# Patient Record
Sex: Female | Born: 2000
Health system: Southern US, Community
[De-identification: ages and names within clinical notes are randomized; demographics above are authoritative.]

## PROBLEM LIST (undated history)

## (undated) ENCOUNTER — Emergency Department (HOSPITAL_COMMUNITY): Admission: EM | Payer: Self-pay | Source: Home / Self Care

## (undated) DIAGNOSIS — F32A Depression, unspecified: Secondary | ICD-10-CM

## (undated) DIAGNOSIS — F419 Anxiety disorder, unspecified: Secondary | ICD-10-CM

## (undated) DIAGNOSIS — F329 Major depressive disorder, single episode, unspecified: Secondary | ICD-10-CM

## (undated) HISTORY — PX: KNEE SURGERY: SHX244

## (undated) HISTORY — DX: Depression, unspecified: F32.A

## (undated) HISTORY — DX: Anxiety disorder, unspecified: F41.9

---
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
## 2018-01-27 DIAGNOSIS — J029 Acute pharyngitis, unspecified: Secondary | ICD-10-CM | POA: Diagnosis not present

## 2018-02-21 DIAGNOSIS — G44209 Tension-type headache, unspecified, not intractable: Secondary | ICD-10-CM | POA: Diagnosis not present

## 2018-06-16 DIAGNOSIS — M25561 Pain in right knee: Secondary | ICD-10-CM | POA: Diagnosis not present

## 2018-06-16 DIAGNOSIS — M25562 Pain in left knee: Secondary | ICD-10-CM | POA: Diagnosis not present

## 2018-06-16 DIAGNOSIS — H43393 Other vitreous opacities, bilateral: Secondary | ICD-10-CM | POA: Diagnosis not present

## 2018-06-16 DIAGNOSIS — H47393 Other disorders of optic disc, bilateral: Secondary | ICD-10-CM | POA: Diagnosis not present

## 2018-07-07 ENCOUNTER — Emergency Department (HOSPITAL_COMMUNITY): Payer: 59

## 2018-07-07 ENCOUNTER — Encounter (HOSPITAL_COMMUNITY): Payer: Self-pay | Admitting: *Deleted

## 2018-07-07 ENCOUNTER — Emergency Department (HOSPITAL_COMMUNITY)
Admission: EM | Admit: 2018-07-07 | Discharge: 2018-07-07 | Disposition: A | Discharge status: Home / Self Care | Payer: 59 | Attending: Emergency Medicine | Admitting: Emergency Medicine

## 2018-07-07 DIAGNOSIS — M25561 Pain in right knee: Secondary | ICD-10-CM | POA: Diagnosis not present

## 2018-07-07 DIAGNOSIS — Z79899 Other long term (current) drug therapy: Secondary | ICD-10-CM | POA: Insufficient documentation

## 2018-07-07 MED ORDER — IBUPROFEN 400 MG PO TABS
600.0000 mg | ORAL_TABLET | Freq: Once | ORAL | Status: AC
  Administered 2018-07-07: 600 mg via ORAL
  Filled 2018-07-07: qty 1

## 2018-07-07 MED ORDER — OXYCODONE-ACETAMINOPHEN 5-325 MG PO TABS
1.0000 | ORAL_TABLET | Freq: Once | ORAL | Status: AC
Start: 2018-07-07 — End: 2018-07-07
  Administered 2018-07-07: 1 via ORAL
  Filled 2018-07-07: qty 1

## 2018-07-07 NOTE — ED Notes (Signed)
Patient Alert and oriented to baseline. Stable and ambulatory to baseline. Patient verbalized understanding of the discharge instructions.  Patient belongings were taken by the patient.   

## 2018-07-07 NOTE — ED Notes (Signed)
Pt standing at bedside, ambulatory without any assistance.

## 2018-07-07 NOTE — ED Notes (Signed)
ED Provider at bedside. 

## 2018-07-07 NOTE — ED Provider Notes (Signed)
[facility] EMERGENCY DEPARTMENT Provider Note   CSN: [id number] Arrival date & time: 07/07/18  1246     History   Chief Complaint Chief Complaint  Patient presents with  . Knee Injury    HPI Diana Sandoval is a 17 y.o. female.  HPI Patient is a 16 year old female with history of recurrent patellar dislocations who presents to the emergency department today for evaluation of right knee pain.  States that she was getting ready for work today and felt her knee pop like it was dislocated again.  States she has had increased pain with trying to bend her knee and has been unable to bend at the knee since episode due to pain.  No numbness/weakness. Is followed by orthopedic surgery for her recurrent dislocations and was seen by them approximately 2 weeks and told to wear a knee brace.  Reports that she was at the beach last week and has not been wearing her brace recently.  Did apply brace after today which does provide some support.  Does not use crutches. No medications prior to coming to the emergency department.  States that she thinks it is more swollen than normal.  No history of knee surgeries.  States she was told by her orthopedist that she may require knee surgery in the future if symptoms persist.  Describes pain as "it feels like Velcro in my knee" and sometimes shooting pain. No fevers.   History reviewed. No pertinent past medical history.  There are no active problems to display for this patient.   History reviewed. No pertinent surgical history.   OB History   None      Home Medications    Prior to Admission medications   Medication Sig Start Date End Date Taking? Authorizing Provider  ibuprofen (ADVIL,MOTRIN) 400 MG tablet Take 400 mg by mouth every 6 (six) hours as needed.    [provider]  loratadine (CLARITIN) 10 MG tablet Take 1 tablet (10 mg total) by mouth daily. 11/21/14   Piepenbrink, Victorino Dike, PA-C  moxifloxacin (VIGAMOX) 0.5 %  ophthalmic solution Place 1 drop into both eyes 3 (three) times daily.    [provider]  naphazoline-pheniramine (NAPHCON-A) 0.025-0.3 % ophthalmic solution Place 1 drop into both eyes 4 (four) times daily as needed for irritation or allergies. 11/21/14   PiepenbrinkVictorino Dike, PA-C    Family History History reviewed. No pertinent family history.  Social History Social History   Tobacco Use  . Smoking status: Never Smoker  . Smokeless tobacco: Never Used  Substance Use Topics  . Alcohol use: Not on file  . Drug use: Not on file     Allergies   Patient has no known allergies.   Review of Systems Review of Systems  Constitutional: Negative for chills and fever.  HENT: Negative for congestion and sore throat.   Gastrointestinal: Negative for diarrhea, nausea and vomiting.  Genitourinary: Negative for dysuria.  Musculoskeletal: Positive for arthralgias (right knee pain) and gait problem. Negative for back pain and neck pain.  Skin: Negative for color change, rash and wound.  Neurological: Negative for weakness and numbness.  All other systems reviewed and are negative.    Physical Exam Updated Vital Signs BP (!) 128/91 (BP Location: Left Arm)   Pulse 87   Temp 98.8 F (37.1 C) (Oral)   Resp 16   LMP 06/24/2018   SpO2 99%   Physical Exam  Constitutional: She appears well-developed and well-nourished. No distress.  HENT:  Head: Normocephalic and atraumatic.  Eyes: Conjunctivae are normal.  Neck: Neck supple.  Cardiovascular: Regular rhythm and intact distal pulses.  Pulmonary/Chest: Effort normal and breath sounds normal. No respiratory distress.  Abdominal: Soft. She exhibits no distension. There is no tenderness.  Musculoskeletal: She exhibits no edema.  Pain and swelling at right knee. No major effusion. No obvious dislocation or deformity. No warmth or erythema. No rash or wounds. Full ROM at hip and ankle. Pt hesitant to move knee initially with  anticipatory guarding however will fully flex/extend actively and passively. Sensation intact throughout BLEs. Symmetric 2+ DP/PT pulses bilat.   Neurological: She is alert.  Skin: Skin is warm and dry. Capillary refill takes less than 2 seconds. She is not diaphoretic.  Psychiatric: She has a normal mood and affect.  Nursing note and vitals reviewed.    ED Treatments / Results  Labs (all labs ordered are listed, but only abnormal results are displayed) Labs Reviewed - No data to display  EKG None  Radiology Dg Knee Complete 4 Views Right  Result Date: 07/07/2018 CLINICAL DATA:  Acute knee pain for the past 2 weeks. No known injury. History of prior right patellar dislocation. EXAM: RIGHT KNEE - COMPLETE 4+ VIEW COMPARISON:  Right knee x-rays dated October 11, 2012. FINDINGS: No acute fracture or dislocation. No joint effusion. Joint spaces are preserved. Bone mineralization is normal. 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 Soft tissues are unremarkable. IMPRESSION: Negative. Electronically Signed   By: Obie DredgeWilliam T Derry M.D.   On: 07/07/2018 14:42    Procedures Procedures (including critical care time)  Medications Ordered in ED Medications  oxyCODONE-acetaminophen (PERCOCET/ROXICET) 5-325 MG per tablet 1 tablet (1 tablet Oral Given 07/07/18 1627)  ibuprofen (ADVIL,MOTRIN) tablet 600 mg (600 mg Oral Given 07/07/18 1627)     Initial Impression / Assessment and Plan / ED Course  I have reviewed the triage vital signs and the nursing notes.  Pertinent labs & imaging results that were available during my care of the patient were reviewed by me and considered in my medical decision making (see chart for details).    Patient is a 17 year old female with history of recurrent patellar dislocations who presents to the emergency department today for evaluation of acute on chronic right knee pain.   Afebrile, hemodynamically stable, well-appearing.  History exam as above.  No  obvious fracture dislocation including on review of x-ray.  Neurovascularly intact with no evidence of knee or patellar dislocation at this time.  Pain limits range of motion. Percocet and ibuprofen given for pain. Patient with no significant joint laxity to suggest complete ligamentous tear (negative anterior/posterior drawer, negative lachman) Does describe some catching of the knee which could indicate meniscal injury or cartilaginous bodies in joint space. Smoothly flexes/extends in ED w/no catching. Has knee brace with her from orthopedics. Crutches offered. Discussed RICE therapy and symptomatic mgt and encouraged close ortho f/u for chronic knee pain with concern for recurrent patellar dislocations. Pt and mother agreeable with this plan. Stable at discharge.   Case and plan of care discussed with Dr. Rhunette CroftNanavati.   Final Clinical Impressions(s) / ED Diagnoses   Final diagnoses:  Acute pain of right knee    ED Discharge Orders    None       Rigoberto Noelickens, Heloise Gordan, MD 07/07/18 Morton [id number]    Nanavati, Ankit, MD 07/09/18 [phone]

## 2018-07-07 NOTE — ED Triage Notes (Signed)
Pt in c/o right knee pain after possible injury at work, pt states she has trouble with her knee popping out of place and today pain increased, worse with movement or trying to bend her knee

## 2018-07-11 ENCOUNTER — Encounter: Payer: Self-pay | Admitting: Physical Therapy

## 2018-07-11 ENCOUNTER — Ambulatory Visit: Payer: 59 | Attending: Family Medicine | Admitting: Physical Therapy

## 2018-07-11 DIAGNOSIS — M6281 Muscle weakness (generalized): Secondary | ICD-10-CM | POA: Insufficient documentation

## 2018-07-11 DIAGNOSIS — G8929 Other chronic pain: Secondary | ICD-10-CM | POA: Insufficient documentation

## 2018-07-11 DIAGNOSIS — R262 Difficulty in walking, not elsewhere classified: Secondary | ICD-10-CM | POA: Diagnosis present

## 2018-07-11 DIAGNOSIS — M25562 Pain in left knee: Secondary | ICD-10-CM | POA: Insufficient documentation

## 2018-07-11 DIAGNOSIS — M25561 Pain in right knee: Secondary | ICD-10-CM | POA: Diagnosis not present

## 2018-07-11 NOTE — Therapy (Signed)
[facility] [address] [place], [place], [address] Phone: [phone]   Fax:  [phone]  Physical Therapy Evaluation  Patient Details  Name: Diana FleetingSydnee G Thal MRN: [id number] Date of Birth: [birth year] Referring Provider: Otho Darnerominic W. Mckinley MD   Encounter Date: 07/11/2018  PT End of Session - 07/11/18 1641    Visit Number  1    Number of Visits  8    Date for PT Re-Evaluation  08/15/18    PT Start Time  0950    PT Stop Time  1038    PT Time Calculation (min)  48 min    Activity Tolerance  Other (comment)   fear and apprehension with movement   Behavior During Therapy  Anxious       History reviewed. No pertinent past medical history.  History reviewed. No pertinent surgical history.  There were no vitals filed for this visit.   Subjective Assessment - 07/11/18 1637    Subjective  Patient arrives to physical therapy with mother wearing a soft knee brace on right knee with reports of a history of knee pain, subluxations, and dislocations. Patient reported this began approximately 6 years ago but the latest exacerbation occurred on 07/07/18 when she felt her R knee cap "popped out and in" while wearing the brace. She reported going to the ER secondary to pain to which X-Ray showed normal placement of patellas. Patient reports at worst pain is 6/10 with sharp pains throughout knee and in patella tendon. At best, patient's pain is 1/10 in L knee and 2/10 in right knee. Patient is having difficulty walking, ambulating steps, and is very apprehensive about any knee movement especially R knee ROM. Patient's goals are to decrease pain, stop the "popping" and climb steps without pain.    Patient is accompained by:  Family member    Pertinent History  history of multiple subluxations, panic attacks    Limitations  House hold activities;Walking    Diagnostic tests  x-ray    Patient Stated Goals  stop wearing the brace, climb steps without pain     Currently in Pain?  Yes    Pain Score  4     Pain Location  Knee    Pain Orientation  Right;Left    Pain Type  Acute pain    Pain Onset  More than a month ago    Pain Frequency  Intermittent    Aggravating Factors   movement, climbing steps    Pain Relieving Factors  ibuprofen    Effect of Pain on Daily Activities  difficulty walking, difficulty climbing steps         [facility] - 07/11/18 0001      Assessment   Medical Diagnosis  Right and left patellofemoral subluxation and patellofemoral syndrom    Referring Provider  Otho Darnerominic W. Mckinley MD    Onset Date/Surgical Date  07/07/18    Next MD Visit  07/15/18    Prior Therapy  yes      Precautions   Precautions  None      Restrictions   Weight Bearing Restrictions  No      Balance Screen   Has the patient fallen in the past 6 months  No    Has the patient had a decrease in activity level because of a fear of falling?   No    Is the patient reluctant to leave their home because of a fear of falling?   No  Home Environment   Living Environment  Private residence      Prior Function   Level of Independence  Independent    Vocation  Student      Observation/Other Assessments-Edema    Edema  Circumferential      Circumferential Edema   Circumferential - Right  36 cm at mid patella    Circumferential - Left   35 cm at mid patella      Posture/Postural Control   Posture Comments  noted with bilateral knee valgus in standing,      ROM / Strength   AROM / PROM / Strength  AROM;Strength      AROM   Overall AROM   Within functional limits for tasks performed    Overall AROM Comments  WNL bilaterally but very apprehensive to move with increased time to reach end range      Strength   Overall Strength  Deficits    Overall Strength Comments  fearful of pain and subluxation with bilateral knee MMT    Strength Assessment Site  Knee;Hip    Right/Left Hip  Right;Left    Right Hip Flexion  3+/5    Right Hip  Extension  3/5    Right Hip ABduction  3+/5    Left Hip Flexion  3+/5    Left Hip Extension  3+/5    Left Hip ABduction  3+/5    Right/Left Knee  Left;Right    Right Knee Flexion  3+/5   fearful   Right Knee Extension  3+/5    Left Knee Flexion  3+/5    Left Knee Extension  3+/5      Palpation   Palpation comment  very tender to palpation along bilateral patella R>L                Objective measurements completed on examination: See above findings.              PT Education - 07/11/18 1641    Education Details  Quad sets, hamstring isometric, glute sets, abduction isometric    Person(s) Educated  Patient;Parent(s)    Methods  Explanation;Demonstration;Handout    Comprehension  Returned demonstration;Verbalized understanding          PT Long Term Goals - 07/11/18 1647      PT LONG TERM GOAL #1   Title  Patient will be independent with HEP    Time  4    Period  Weeks    Status  New      PT LONG TERM GOAL #2   Title  Patient will demonstrate 4/5 or greater bilateral knee MMT in both planes to improve stability during gait and functional activities    Time  4    Period  Weeks    Status  New      PT LONG TERM GOAL #3   Title  Patient will negotiate a full flight of stairs with reciprocating gait pattern and pain less than 3/10 in bilateral knees.    Time  4    Period  Weeks    Status  New      PT LONG TERM GOAL #4   Title  Patient will report ability to ambulate campus with less than 3/10 pain in bilateral knees.    Time  4    Period  Weeks    Status  New             Plan - 07/11/18 1643  Clinical Impression Statement  Patient is a 17 year old female who presents to physical therapy with a soft right knee brace donned and bilateral knee pain, increased edema and decreased MMT in bilateral knees and bilateral hips. Patient was very apprehensive to move in any plane of motion secondary to crepitus and the fear of her bilateral patellas  subluxing. Patient otherwise has WNL ROM bilaterally. Patient tender to touch around patella especially medial and lateral aspects of both patellas. Patient noted with increased edema R>L. Patient noted with laterally placed patellas R>L and noted knee valgus R>L in standing. Patient expressed her fear of pain and subluxation throughout assessment portion of evaluation and was unable to relax LEs during assessment. Patella mobility and special tests were not assessed secondary to fear. Patient may benefit from skilled physical therapy to decrease pain, improve mobility and address patient's goals.     Clinical Presentation  Evolving    Clinical Decision Making  Low    Rehab Potential  Good    Clinical Impairments Affecting Rehab Potential  fear of movement, panic attacks,    PT Frequency  2x / week    PT Duration  4 weeks    PT Treatment/Interventions  Cryotherapy;Microbiologist;Therapeutic exercise;Gait training;Stair training;Neuromuscular re-education;Patient/family education;Vasopneumatic Device;Taping;Manual techniques;Passive range of motion    PT Next Visit Plan  Quad strengthening to patient tolerance, if intolerable, VMS to R Quad,  hip strengthening, modalities PRN for pain relief.    PT Home Exercise Plan  see patient education    Consulted and Agree with Plan of Care  Patient;Family member/caregiver    Family Member Consulted  Mother       Patient will benefit from skilled therapeutic intervention in order to improve the following deficits and impairments:  Pain, Postural dysfunction, Decreased strength, Decreased range of motion, Decreased activity tolerance, Difficulty walking  Visit Diagnosis: Chronic pain of right knee  Chronic pain of left knee  Muscle weakness (generalized)  Difficulty in walking, not elsewhere classified     Problem List There are no active problems to display for this patient.  Guss Bunde, PT, DPT 07/11/2018, 4:56  PM  [facility] [address] [place], [place], [address] Phone: [phone]   Fax:  [phone]  Name: ASHLE STIEF MRN: [id number] Date of Birth: February 16, 2001

## 2018-07-15 ENCOUNTER — Ambulatory Visit: Payer: 59 | Admitting: Physical Therapy

## 2018-07-15 ENCOUNTER — Ambulatory Visit (INDEPENDENT_AMBULATORY_CARE_PROVIDER_SITE_OTHER): Payer: 59 | Admitting: Women's Health

## 2018-07-15 ENCOUNTER — Encounter: Payer: Self-pay | Admitting: Women's Health

## 2018-07-15 VITALS — BP 118/76 | Ht 68.0 in | Wt 141.0 lb

## 2018-07-15 DIAGNOSIS — R262 Difficulty in walking, not elsewhere classified: Secondary | ICD-10-CM

## 2018-07-15 DIAGNOSIS — M6281 Muscle weakness (generalized): Secondary | ICD-10-CM

## 2018-07-15 DIAGNOSIS — M25562 Pain in left knee: Secondary | ICD-10-CM

## 2018-07-15 DIAGNOSIS — M25561 Pain in right knee: Secondary | ICD-10-CM | POA: Diagnosis not present

## 2018-07-15 DIAGNOSIS — Z789 Other specified health status: Secondary | ICD-10-CM | POA: Diagnosis not present

## 2018-07-15 DIAGNOSIS — Z01419 Encounter for gynecological examination (general) (routine) without abnormal findings: Secondary | ICD-10-CM

## 2018-07-15 DIAGNOSIS — IMO0001 Reserved for inherently not codable concepts without codable children: Secondary | ICD-10-CM

## 2018-07-15 DIAGNOSIS — G8929 Other chronic pain: Secondary | ICD-10-CM

## 2018-07-15 MED ORDER — DROSPIRENONE-ETHINYL ESTRADIOL 3-0.02 MG PO TABS: 1.0000 | ORAL_TABLET | Freq: Every day | ORAL | 4 refills | Status: DC

## 2018-07-15 MED ORDER — NORETHIN ACE-ETH ESTRAD-FE 1-20 MG-MCG PO TABS: 1.0000 | ORAL_TABLET | Freq: Every day | ORAL | 4 refills | Status: DC

## 2018-07-15 NOTE — Therapy (Signed)
[facility] [address] [place], [place], [address] Phone: [phone]   Fax:  [phone]  Physical Therapy Treatment  Patient Details  Name: Diana Sandoval MRN: [id number] Date of Birth: Mar 12, 2001 Referring Provider: Otho Darnerominic W. Mckinley MD   Encounter Date: 07/15/2018  PT End of Session - 07/15/18 1037    Visit Number  2    Number of Visits  8    Date for PT Re-Evaluation  08/15/18    PT Start Time  0947    PT Stop Time  1036    PT Time Calculation (min)  49 min    Activity Tolerance  Other (comment)    Behavior During Therapy  Anxious       Past Medical History:  Diagnosis Date  . Anxiety     No past surgical history on file.  There were no vitals filed for this visit.  Subjective Assessment - 07/15/18 2130    Subjective  Patient reports 2/10 in right knee today. Patient has been complaint with exercises with exception of hip abduction isometric secondary to fear.     Patient is accompained by:  Family member    Pertinent History  history of multiple subluxations, panic attacks    Limitations  House hold activities;Walking    Diagnostic tests  x-ray    Currently in Pain?  Yes    Pain Score  2     Pain Orientation  Right    Pain Descriptors / Indicators  Sore    Pain Type  Acute pain    Pain Onset  More than a month ago         [facility] - 07/15/18 0001      Assessment   Medical Diagnosis  Right and left patellofemoral subluxation and patellofemoral syndrom    Onset Date/Surgical Date  07/07/18    Next MD Visit  07/15/18    Prior Therapy  yes                   [facility] Adult PT Treatment/Exercise - 07/15/18 0001      Exercises   Exercises  Knee/Hip      Knee/Hip Exercises: Aerobic   Stationary Bike  level 2 x1055minutes      Knee/Hip Exercises: Standing   Hip Flexion  AROM;Both;10 reps;Knee bent    Hip Abduction  AROM   attempted however patient apprehensive with movement     Knee/Hip Exercises: Supine   Quad Sets  AROM;Strengthening;Both;2 sets;10 reps    Bridges  AROM;Strengthening;Both;2 sets;10 reps    Other Supine Knee/Hip Exercises  hip abduction isometric 5" hold x20 each; hip adduction ball squeeze 5" hold x20 supine clam shells 2x10 yellow theraband                  PT Long Term Goals - 07/11/18 1647      PT LONG TERM GOAL #1   Title  Patient will be independent with HEP    Time  4    Period  Weeks    Status  New      PT LONG TERM GOAL #2   Title  Patient will demonstrate 4/5 or greater bilateral knee MMT in both planes to improve stability during gait and functional activities    Time  4    Period  Weeks    Status  New      PT LONG TERM GOAL #3   Title  Patient will negotiate a  full flight of stairs with reciprocating gait pattern and pain less than 3/10 in bilateral knees.    Time  4    Period  Weeks    Status  New      PT LONG TERM GOAL #4   Title  Patient will report ability to ambulate campus with less than 3/10 pain in bilateral knees.    Time  4    Period  Weeks    Status  New            Plan - 07/15/18 2134    Clinical Impression Statement  Patient was able to tolerate treatment fairly. Patient was very nervous and apprehensive about movement and frequently placed her right hand on the lateral aspect of her right knee to prevent subluxation. Patient required increased time to perform activity secondary to weakness as well as a fear to transition from knee flexion to extension. Patient and mother reviewed HEP to which both reported understanding. Patient to attend one more visit with emphasis on independence on HEP as [place]. PT [place] to continue skilled physical therapy to address deficits. Patient and mother reported understanding.     Clinical Presentation  Evolving    Clinical Decision Making  Low    Rehab Potential   Good    Clinical Impairments Affecting Rehab Potential  fear of movement, panic attacks,    PT Frequency  2x / week    PT Duration  4 weeks    PT Treatment/Interventions  Cryotherapy;Microbiologistlectrical Stimulation;Balance training;Therapeutic exercise;Gait training;Stair training;Neuromuscular re-education;Patient/family education;Vasopneumatic Device;Taping;Manual techniques;Passive range of motion    PT Next Visit Plan  Quad strengthening to patient tolerance, if intolerable, VMS to R Quad,  hip strengthening, modalities PRN for pain relief.    Consulted and Agree with Plan of Care  Patient;Family member/caregiver    Family Member Consulted  Mother       Patient will benefit from skilled therapeutic intervention in order to improve the following deficits and impairments:  Pain, Postural dysfunction, Decreased strength, Decreased range of motion, Decreased activity tolerance, Difficulty walking  Visit Diagnosis: Chronic pain of right knee  Chronic pain of left knee  Muscle weakness (generalized)  Difficulty in walking, not elsewhere classified     Problem List There are no active problems to display for this patient.  Guss BundeKrystle Clif Serio, PT, DPT 07/15/2018, 9:39 PM  [facility] [address] [place], [place], [address] Phone: [phone]   Fax:  [phone]  Name: Diana Sandoval MRN: [id number] Date of Birth: 2001/05/11

## 2018-07-15 NOTE — Progress Notes (Signed)
Diana Sandoval [birth year] [id number]    History:    Presents for new patient annual exam.  Cycles every 4 to 5 weeks for 5 days sexually active x1 with condom, denies need for STD screen.  Would like to start on OCs.  Unsure if received gardasil but does think so.  Past medical history, past surgical history, family history and social history were all reviewed and documented in the EPIC chart.  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  Mother hypertension, father healthy.  ROS:  A ROS was performed and pertinent positives and negatives are included.  Exam:  Vitals:   07/15/18 1509  BP: 118/76  Weight: 141 lb (64 kg)  Height: 5\' 8"  (1.727 m)   Body mass index is 21.44 kg/m.   General appearance:  Normal Thyroid:  Symmetrical, normal in size, without palpable masses or nodularity. Respiratory  Auscultation:  Clear without wheezing or rhonchi Cardiovascular  Auscultation:  Regular rate, without rubs, murmurs or gallops  Edema/varicosities:  Not grossly evident Abdominal  Soft,nontender, without masses, guarding or rebound.  Liver/spleen:  No organomegaly noted  Hernia:  None appreciated  Skin  Inspection:  Grossly normal   Breasts: Examined lying and sitting.     Right: Without masses, retractions, discharge or axillary adenopathy.     Left: Without masses, retractions, discharge or axillary adenopathy. Gentitourinary   Inguinal/mons:  Normal without inguinal adenopathy  External genitalia:  Normal  BUS/Urethra/Skene's glands:  Normal  Vagina:  Normal  Cervix:  Normal  Uterus: normal in size, shape and contour.  Midline and mobile  Adnexa/parametria:     Rt: Without masses or tenderness.   Lt: Without masses or tenderness.  Anus and perineum: Normal    Assessment/Plan:  17 y.o. S [employer] G0 for annual exam no complaints.  Monthly cycle desiring OCs Anxiety/needle phobia  Plan: Options reviewed,  does have acne and anxiety.  Yaz prescription, proper use, slight risk for blood clots and strokes reviewed.  Start up instructions discussed.  Encouraged condoms if sexually active, best to continue abstinence.  SBE's, exercise, calcium rich foods, MVI daily encouraged.  Campus safety discussed.  Declined blood work, needle phobia, also does have anxiety and questions if needs medication.  Strongly encouraged counseling first, best to proceed with counseling prior to medication.  Encouraged to schedule at student health.  Instructed to follow-up with primary care/pediatrician if did not receive Gardasil to return to office to complete series.   Harrington Challengerancy J Scarleth Brame [facility]NP, 3:17 PM 07/15/2018

## 2018-07-15 NOTE — Patient Instructions (Addendum)
[facility]  Living With Anxiety After being diagnosed with an anxiety disorder, you may be relieved to know why you have felt or behaved a certain way. It is natural to also feel overwhelmed about the treatment ahead and what it will mean for your life. With care and support, you can manage this condition and recover from it. How to cope with anxiety Dealing with stress Stress is your body's reaction to life changes and events, both good and bad. Stress can last just a few hours or it can be ongoing. Stress can play a major role in anxiety, so it is important to learn both how to cope with stress and how to think about it differently. Talk with your health care provider or a counselor to learn more about stress reduction. He or she may suggest some stress reduction techniques, such as:  Music therapy. This can include creating or listening to music that you enjoy and that inspires you.  Mindfulness-based meditation. This involves being aware of your normal breaths, rather than trying to control your breathing. It can be done while sitting or walking.  Centering prayer. This is a kind of meditation that involves focusing on a word, phrase, or sacred image that is meaningful to you and that brings you peace.  Deep breathing. To do this, expand your stomach and inhale slowly through your nose. Hold your breath for 3-5 seconds. Then exhale slowly, allowing your stomach muscles to relax.  Self-talk. This is a skill where you identify thought patterns that lead to anxiety reactions and correct those thoughts.  Muscle relaxation. This involves tensing muscles then relaxing them.  Choose a stress reduction technique that fits your lifestyle and personality. Stress reduction techniques take time and practice. Set aside 5-15 minutes a day to do them. Therapists can offer training in these techniques. The training may be covered by some insurance plans. Other things you can do to manage stress include:  Keeping  a stress diary. This can help you learn what triggers your stress and ways to control your response.  Thinking about how you respond to certain situations. You may not be able to control everything, but you can control your reaction.  Making time for activities that help you relax, and not feeling guilty about spending your time in this way.  Therapy combined with coping and stress-reduction skills provides the best chance for successful treatment. Medicines Medicines can help ease symptoms. Medicines for anxiety include:  Anti-anxiety drugs.  Antidepressants.  Beta-blockers.  Medicines may be used as the main treatment for anxiety disorder, along with therapy, or if other treatments are not working. Medicines should be prescribed by a health care provider. Relationships Relationships can play a big part in helping you recover. Try to spend more time connecting with trusted friends and family members. Consider going to couples counseling, taking family education classes, or going to family therapy. Therapy can help you and others better understand the condition. How to recognize changes in your condition Everyone has a different response to treatment for anxiety. Recovery from anxiety happens when symptoms decrease and stop interfering with your daily activities at home or work. This may mean that you will start to:  Have better concentration and focus.  Sleep better.  Be less irritable.  Have more energy.  Have improved memory.  It is important to recognize when your condition is getting worse. Contact your health care provider if your symptoms interfere with home or work and you do not feel like  your condition is improving. Where to find help and support: You can get help and support from these sources:  Self-help groups.  Online and [employer].  A trusted spiritual leader.  Couples counseling.  Family education classes.  Family therapy.  Follow these  instructions at home:  Eat a healthy diet that includes plenty of vegetables, fruits, whole grains, low-fat dairy products, and lean protein. Do not eat a lot of foods that are high in solid fats, added sugars, or salt.  Exercise. Most adults should do the following: ? Exercise for at least 150 minutes each week. The exercise should increase your heart rate and make you sweat (moderate-intensity exercise). ? Strengthening exercises at least twice a week.  Cut down on caffeine, tobacco, alcohol, and other potentially harmful substances.  Get the right amount and quality of sleep. Most adults need 7-9 hours of sleep each night.  Make choices that simplify your life.  Take over-the-counter and prescription medicines only as told by your health care provider.  Avoid caffeine, alcohol, and certain over-the-counter cold medicines. These may make you feel worse. Ask your pharmacist which medicines to avoid.  Keep all follow-up visits as told by your health care provider. This is important. Questions to ask your health care provider  Would I benefit from therapy?  How often should I follow up with a health care provider?  How long do I need to take medicine?  Are there any long-term side effects of my medicine?  Are there any alternatives to taking medicine? Contact a health care provider if:  You have a hard time staying focused or finishing daily tasks.  You spend many hours a day feeling worried about everyday life.  You become exhausted by worry.  You start to have headaches, feel tense, or have nausea.  You urinate more than normal.  You have diarrhea. Get help right away if:  You have a racing heart and shortness of breath.  You have thoughts of hurting yourself or others. If you ever feel like you may hurt yourself or others, or have thoughts about taking your own life, get help right away. You can go to your nearest emergency department or call:  Your local emergency  services (911 in the [place]  A suicide crisis helpline, such as the [place] at 501-653-2311. This is open 24-hours a day.  Summary  Taking steps to deal with stress can help calm you.  Medicines cannot cure anxiety disorders, but they can help ease symptoms.  Family, friends, and partners can play a big part in helping you recover from an anxiety disorder. This information is not intended to replace advice given to you by your health care provider. Make sure you discuss any questions you have with your health care provider. Document Released: 11/13/2016 Document Revised: 11/13/2016 Document Reviewed: 11/13/2016 Elsevier Interactive Patient Education  2018 [employer] Maintenance, Female Adopting a healthy lifestyle and getting preventive care can go a long way to promote health and wellness. Talk with your health care provider about what schedule of regular examinations is right for you. This is a good chance for you to check in with your provider about disease prevention and staying healthy. In between checkups, there are plenty of things you can do on your own. Experts have done a lot of research about which lifestyle changes and preventive measures are most likely to keep you healthy. Ask your health care provider for more information. Weight and diet Eat  a healthy diet  Be sure to include plenty of vegetables, fruits, low-fat dairy products, and lean protein.  Do not eat a lot of foods high in solid fats, added sugars, or salt.  Get regular exercise. This is one of the most important things you can do for your health. ? Most adults should exercise for at least 150 minutes each week. The exercise should increase your heart rate and make you sweat (moderate-intensity exercise). ? Most adults should also do strengthening exercises at least twice a week. This is in addition to the moderate-intensity exercise.  Maintain a healthy weight  Body  mass index (BMI) is a measurement that can be used to identify possible weight problems. It estimates body fat based on height and weight. Your health care provider can help determine your BMI and help you achieve or maintain a healthy weight.  For females 24 years of age and older: ? A BMI below 18.5 is considered underweight. ? A BMI of 18.5 to 24.9 is normal. ? A BMI of 25 to 29.9 is considered overweight. ? A BMI of 30 and above is considered obese.  Watch levels of cholesterol and blood lipids  You should start having your blood tested for lipids and cholesterol at 19 years of age, then have this test every 5 years.  You may need to have your cholesterol levels checked more often if: ? Your lipid or cholesterol levels are high. ? You are older than 19 years of age. ? You are at high risk for heart disease.  Cancer screening Lung Cancer  Lung cancer screening is recommended for adults 36-12 years old who are at high risk for lung cancer because of a history of smoking.  A yearly low-dose CT scan of the lungs is recommended for people who: ? Currently smoke. ? Have quit within the past 15 years. ? Have at least a 30-pack-year history of smoking. A pack year is smoking an average of one pack of cigarettes a day for 1 year.  Yearly screening should continue until it has been 15 years since you quit.  Yearly screening should stop if you develop a health problem that would prevent you from having lung cancer treatment.  Breast Cancer  Practice breast self-awareness. This means understanding how your breasts normally appear and feel.  It also means doing regular breast self-exams. Let your health care provider know about any changes, no matter how small.  If you are in your 20s or 30s, you should have a clinical breast exam (CBE) by a health care provider every 1-3 years as part of a regular health exam.  If you are 20 or older, have a CBE every year. Also consider having a  breast X-ray (mammogram) every year.  If you have a family history of breast cancer, talk to your health care provider about genetic screening.  If you are at high risk for breast cancer, talk to your health care provider about having an MRI and a mammogram every year.  Breast cancer gene (BRCA) assessment is recommended for women who have family members with BRCA-related cancers. BRCA-related cancers include: ? Breast. ? Ovarian. ? Tubal. ? Peritoneal cancers.  Results of the assessment will determine the need for genetic counseling and BRCA1 and BRCA2 testing.  Cervical Cancer Your health care provider may recommend that you be screened regularly for cancer of the pelvic organs (ovaries, uterus, and vagina). This screening involves a pelvic examination, including checking for microscopic changes to the surface  of your cervix (Pap test). You may be encouraged to have this screening done every 3 years, beginning at age 80.  For women ages 35-65, health care providers may recommend pelvic exams and Pap testing every 3 years, or they may recommend the Pap and pelvic exam, combined with testing for human papilloma virus (HPV), every 5 years. Some types of HPV increase your risk of cervical cancer. Testing for HPV may also be done on women of any age with unclear Pap test results.  Other health care providers may not recommend any screening for nonpregnant women who are considered low risk for pelvic cancer and who do not have symptoms. Ask your health care provider if a screening pelvic exam is right for you.  If you have had past treatment for cervical cancer or a condition that could lead to cancer, you need Pap tests and screening for cancer for at least 20 years after your treatment. If Pap tests have been discontinued, your risk factors (such as having a new sexual partner) need to be reassessed to determine if screening should resume. Some women have medical problems that increase the chance  of getting cervical cancer. In these cases, your health care provider may recommend more frequent screening and Pap tests.  Colorectal Cancer  This type of cancer can be detected and often prevented.  Routine colorectal cancer screening usually begins at 19 years of age and continues through 19 years of age.  Your health care provider may recommend screening at an earlier age if you have risk factors for colon cancer.  Your health care provider may also recommend using home test kits to check for hidden blood in the stool.  A small camera at the end of a tube can be used to examine your colon directly (sigmoidoscopy or colonoscopy). This is done to check for the earliest forms of colorectal cancer.  Routine screening usually begins at age 85.  Direct examination of the colon should be repeated every 5-10 years through 19 years of age. However, you may need to be screened more often if early forms of precancerous polyps or small growths are found.  Skin Cancer  Check your skin from head to toe regularly.  Tell your health care provider about any new moles or changes in moles, especially if there is a change in a mole's shape or color.  Also tell your health care provider if you have a mole that is larger than the size of a pencil eraser.  Always use sunscreen. Apply sunscreen liberally and repeatedly throughout the day.  Protect yourself by wearing long sleeves, pants, a wide-brimmed hat, and sunglasses whenever you are outside.  Heart disease, diabetes, and high blood pressure  High blood pressure causes heart disease and increases the risk of stroke. High blood pressure is more likely to develop in: ? People who have blood pressure in the high end of the normal range (130-139/85-89 mm Hg). ? People who are overweight or obese. ? People who are African American.  If you are 73-64 years of age, have your blood pressure checked every 3-5 years. If you are 72 years of age or older,  have your blood pressure checked every year. You should have your blood pressure measured twice-once when you are at a hospital or clinic, and once when you are not at a hospital or clinic. Record the average of the two measurements. To check your blood pressure when you are not at a hospital or clinic, you can use: ?  An automated blood pressure machine at a pharmacy. ? A home blood pressure monitor.  If you are between 12 years and 65 years old, ask your health care provider if you should take aspirin to prevent strokes.  Have regular diabetes screenings. This involves taking a blood sample to check your fasting blood sugar level. ? If you are at a normal weight and have a low risk for diabetes, have this test once every three years after 19 years of age. ? If you are overweight and have a high risk for diabetes, consider being tested at a younger age or more often. Preventing infection Hepatitis B  If you have a higher risk for hepatitis B, you should be screened for this virus. You are considered at high risk for hepatitis B if: ? You were born in a country where hepatitis B is common. Ask your health care provider which countries are considered high risk. ? Your parents were born in a high-risk country, and you have not been immunized against hepatitis B (hepatitis B vaccine). ? You have HIV or AIDS. ? You use needles to inject street drugs. ? You live with someone who has hepatitis B. ? You have had sex with someone who has hepatitis B. ? You get hemodialysis treatment. ? You take certain medicines for conditions, including cancer, organ transplantation, and autoimmune conditions.  Hepatitis C  Blood testing is recommended for: ? Everyone born from [birth year] through 1967. ? Anyone with known risk factors for hepatitis C.  Sexually transmitted infections (STIs)  You should be screened for sexually transmitted infections (STIs) including gonorrhea and chlamydia if: ? You are sexually  active and are younger than 19 years of age. ? You are older than 19 years of age and your health care provider tells you that you are at risk for this type of infection. ? Your sexual activity has changed since you were last screened and you are at an increased risk for chlamydia or gonorrhea. Ask your health care provider if you are at risk.  If you do not have HIV, but are at risk, it may be recommended that you take a prescription medicine daily to prevent HIV infection. This is called pre-exposure prophylaxis (PrEP). You are considered at risk if: ? You are sexually active and do not regularly use condoms or know the HIV status of your partner(s). ? You take drugs by injection. ? You are sexually active with a partner who has HIV.  Talk with your health care provider about whether you are at high risk of being infected with HIV. If you choose to begin PrEP, you should first be tested for HIV. You should then be tested every 3 months for as long as you are taking PrEP. Pregnancy  If you are premenopausal and you may become pregnant, ask your health care provider about preconception counseling.  If you may become pregnant, take 400 to 800 micrograms (mcg) of folic acid every day.  If you want to prevent pregnancy, talk to your health care provider about birth control (contraception). Osteoporosis and menopause  Osteoporosis is a disease in which the bones lose minerals and strength with aging. This can result in serious bone fractures. Your risk for osteoporosis can be identified using a bone density scan.  If you are 91 years of age or older, or if you are at risk for osteoporosis and fractures, ask your health care provider if you should be screened.  Ask your health care provider whether you  should take a calcium or vitamin D supplement to lower your risk for osteoporosis.  Menopause may have certain physical symptoms and risks.  Hormone replacement therapy may reduce some of these  symptoms and risks. Talk to your health care provider about whether hormone replacement therapy is right for you. Follow these instructions at home:  Schedule regular health, dental, and eye exams.  Stay current with your immunizations.  Do not use any tobacco products including cigarettes, chewing tobacco, or electronic cigarettes.  If you are pregnant, do not drink alcohol.  If you are breastfeeding, limit how much and how often you drink alcohol.  Limit alcohol intake to no more than 1 drink per day for nonpregnant women. One drink equals 12 ounces of beer, 5 ounces of wine, or 1 ounces of hard liquor.  Do not use street drugs.  Do not share needles.  Ask your health care provider for help if you need support or information about quitting drugs.  Tell your health care provider if you often feel depressed.  Tell your health care provider if you have ever been abused or do not feel safe at home. This information is not intended to replace advice given to you by your health care provider. Make sure you discuss any questions you have with your health care provider. Document Released: 06/04/2011 Document Revised: 04/26/2016 Document Reviewed: 08/23/2015 Elsevier Interactive Patient Education  [employer].

## 2018-07-16 DIAGNOSIS — M25561 Pain in right knee: Secondary | ICD-10-CM | POA: Diagnosis not present

## 2018-07-17 ENCOUNTER — Ambulatory Visit: Payer: 59 | Admitting: Physical Therapy

## 2018-07-17 DIAGNOSIS — M6281 Muscle weakness (generalized): Secondary | ICD-10-CM

## 2018-07-17 DIAGNOSIS — M25562 Pain in left knee: Secondary | ICD-10-CM

## 2018-07-17 DIAGNOSIS — R262 Difficulty in walking, not elsewhere classified: Secondary | ICD-10-CM

## 2018-07-17 DIAGNOSIS — M25561 Pain in right knee: Principal | ICD-10-CM

## 2018-07-17 DIAGNOSIS — G8929 Other chronic pain: Secondary | ICD-10-CM

## 2018-07-17 NOTE — Therapy (Signed)
[facility] [place], [place], [address] Phone: [phone]   Fax:  [phone]  Physical Therapy Treatment  Patient Details  Name: Diana Sandoval MRN: [id number] Date of Birth: 2001/02/27 Referring Provider: Gentry Fitz MD   Encounter Date: 07/17/2018  PT End of Session - 07/17/18 1525    Visit Number  3    Number of Visits  8    Date for PT Re-Evaluation  08/15/18    PT Start Time  2637   late arrival   PT Stop Time  1436    PT Time Calculation (min)  39 min    Activity Tolerance  Other (comment)   right knee brace   Behavior During Therapy  Anxious       Past Medical History:  Diagnosis Date  . Anxiety     No past surgical history on file.  There were no vitals filed for this visit.  Subjective Assessment - 07/17/18 1702    Subjective  Patient reports L knee is beginning to hurt more than her right. She found an old brace that reports is more comfortable than her other.     Patient is accompained by:  Family member    Pertinent History  history of multiple subluxations, panic attacks    Limitations  House hold activities;Walking    Diagnostic tests  x-ray    Patient Stated Goals  stop wearing the brace, climb steps without pain    Currently in Pain?  Yes    Pain Score  4    2/10 in R knee   Pain Location  Knee    Pain Orientation  Left    Pain Descriptors / Indicators  Sore;Tightness    Pain Type  Acute pain    Pain Onset  Yesterday    Pain Frequency  Intermittent         [facility] PT Assessment - 07/17/18 0001      Assessment   Medical Diagnosis  Right and left patellofemoral subluxation and patellofemoral syndrom    Onset Date/Surgical Date  07/07/18    Next MD Visit  07/15/18    Prior Therapy  yes                   [facility] PT Treatment/Exercise - 07/17/18 0001      Exercises   Exercises  Knee/Hip      Knee/Hip Exercises: Aerobic   Stationary Bike  Level 2 x10  minutes      Knee/Hip Exercises: Standing   Functional Squat  Other (comment)   attempted, R knee pain   Other Standing Knee Exercises  lateral stepping x2 minutes      Knee/Hip Exercises: Supine   Quad Sets  AROM;Strengthening;Both;2 sets;10 reps    Short Arc Quad Sets  AROM;Strengthening;Both;2 sets;10 reps   5" hold     Manual Therapy   Manual Therapy  Taping    Kinesiotex  Ligament Correction   patella femoral tracking     Kinesiotix   Ligament Correction  patella tracking lateral tape only                  PT Long Term Goals - 07/17/18 1650      PT LONG TERM GOAL #1   Title  Patient will be independent with HEP    Time  4    Period  Weeks    Status  Achieved      PT LONG TERM  GOAL #2   Title  Patient will demonstrate 4/5 or greater bilateral knee MMT in both planes to improve stability during gait and functional activities    Time  4    Period  Weeks    Status  Not Met      PT LONG TERM GOAL #3   Title  Patient will negotiate a full flight of stairs with reciprocating gait pattern and pain less than 3/10 in bilateral knees.    Time  4    Period  Weeks    Status  Not Met      PT LONG TERM GOAL #4   Title  Patient will report ability to ambulate campus with less than 3/10 pain in bilateral knees.    Time  4    Period  Weeks    Status  Not Met            Plan - 07/17/18 1651    Clinical Impression Statement  Patient was able to tolerate treatment well despite reports of bilateral knee pain. Patient was able to demonstrate exercises on HEP with good form and minimal reports of pain, just muscle soreness. Patient very apprehensive with lateral stepping as patient reported that movement can cause subluxations. Patient was able to complete with no reports of pain.  Patient attempted mini squat; patient unable to tolerate at this time. Kinesio taping applied to lateral aspect of the patella. Medial tape was applied however patient was not comfortable  with it on therefore it was removed. Patient and mother educated kinesiotaping can be found at [employer]'s sports section, the proper application of tape, and youtube.com is a great resource if unable to remember proper application. Patient will be returning to school this weekend;  PT's goals were not met at this time. DC.     Clinical Presentation  Evolving    Clinical Decision Making  Low    Rehab Potential  Good    Clinical Impairments Affecting Rehab Potential  fear of movement, panic attacks,    PT Frequency  2x / week    PT Duration  4 weeks    PT Treatment/Interventions  Cryotherapy;Lobbyist;Therapeutic exercise;Gait training;Stair training;Neuromuscular re-education;Patient/family education;Vasopneumatic Device;Taping;Manual techniques;Passive range of motion    PT Next Visit Plan  DC    PT Home Exercise Plan  see patient education    Consulted and Agree with Plan of Care  Patient    Family Member Consulted  Mother       Patient will benefit from skilled therapeutic intervention in order to improve the following deficits and impairments:  Pain, Postural dysfunction, Decreased strength, Decreased range of motion, Decreased activity tolerance, Difficulty walking  Visit Diagnosis: Chronic pain of right knee  Chronic pain of left knee  Muscle weakness (generalized)  Difficulty in walking, not elsewhere classified     Problem List There are no active problems to display for this patient.  Gabriela Eves, PT, DPT 07/17/2018, 5:08 PM  [facility] [place], [place], [address] Phone: [phone]   Fax:  [phone]  Name: Diana Sandoval MRN: [id number] Date of Birth: [birth year]

## 2018-07-18 ENCOUNTER — Ambulatory Visit: Payer: 59 | Admitting: Physical Therapy

## 2018-07-29 DIAGNOSIS — S83001D Unspecified subluxation of right patella, subsequent encounter: Secondary | ICD-10-CM | POA: Diagnosis not present

## 2018-07-29 DIAGNOSIS — M25661 Stiffness of right knee, not elsewhere classified: Secondary | ICD-10-CM | POA: Diagnosis not present

## 2018-07-29 DIAGNOSIS — S83002D Unspecified subluxation of left patella, subsequent encounter: Secondary | ICD-10-CM | POA: Diagnosis not present

## 2018-07-31 DIAGNOSIS — S83001D Unspecified subluxation of right patella, subsequent encounter: Secondary | ICD-10-CM | POA: Diagnosis not present

## 2018-07-31 DIAGNOSIS — S83002D Unspecified subluxation of left patella, subsequent encounter: Secondary | ICD-10-CM | POA: Diagnosis not present

## 2018-07-31 DIAGNOSIS — M25661 Stiffness of right knee, not elsewhere classified: Secondary | ICD-10-CM | POA: Diagnosis not present

## 2018-08-12 DIAGNOSIS — S83001D Unspecified subluxation of right patella, subsequent encounter: Secondary | ICD-10-CM | POA: Diagnosis not present

## 2018-08-12 DIAGNOSIS — M25661 Stiffness of right knee, not elsewhere classified: Secondary | ICD-10-CM | POA: Diagnosis not present

## 2018-08-12 DIAGNOSIS — S83002D Unspecified subluxation of left patella, subsequent encounter: Secondary | ICD-10-CM | POA: Diagnosis not present

## 2018-08-14 DIAGNOSIS — S83002D Unspecified subluxation of left patella, subsequent encounter: Secondary | ICD-10-CM | POA: Diagnosis not present

## 2018-08-14 DIAGNOSIS — M25661 Stiffness of right knee, not elsewhere classified: Secondary | ICD-10-CM | POA: Diagnosis not present

## 2018-08-14 DIAGNOSIS — S83001D Unspecified subluxation of right patella, subsequent encounter: Secondary | ICD-10-CM | POA: Diagnosis not present

## 2018-08-19 DIAGNOSIS — S83002D Unspecified subluxation of left patella, subsequent encounter: Secondary | ICD-10-CM | POA: Diagnosis not present

## 2018-08-19 DIAGNOSIS — S83001D Unspecified subluxation of right patella, subsequent encounter: Secondary | ICD-10-CM | POA: Diagnosis not present

## 2018-08-19 DIAGNOSIS — M25661 Stiffness of right knee, not elsewhere classified: Secondary | ICD-10-CM | POA: Diagnosis not present

## 2018-08-21 DIAGNOSIS — S83001D Unspecified subluxation of right patella, subsequent encounter: Secondary | ICD-10-CM | POA: Diagnosis not present

## 2018-08-21 DIAGNOSIS — S83002D Unspecified subluxation of left patella, subsequent encounter: Secondary | ICD-10-CM | POA: Diagnosis not present

## 2018-08-21 DIAGNOSIS — M25661 Stiffness of right knee, not elsewhere classified: Secondary | ICD-10-CM | POA: Diagnosis not present

## 2018-08-28 DIAGNOSIS — S83001D Unspecified subluxation of right patella, subsequent encounter: Secondary | ICD-10-CM | POA: Diagnosis not present

## 2018-08-28 DIAGNOSIS — M25661 Stiffness of right knee, not elsewhere classified: Secondary | ICD-10-CM | POA: Diagnosis not present

## 2018-08-28 DIAGNOSIS — S83002D Unspecified subluxation of left patella, subsequent encounter: Secondary | ICD-10-CM | POA: Diagnosis not present

## 2018-09-02 DIAGNOSIS — M25661 Stiffness of right knee, not elsewhere classified: Secondary | ICD-10-CM | POA: Diagnosis not present

## 2018-09-02 DIAGNOSIS — S83002D Unspecified subluxation of left patella, subsequent encounter: Secondary | ICD-10-CM | POA: Diagnosis not present

## 2018-09-02 DIAGNOSIS — S83001D Unspecified subluxation of right patella, subsequent encounter: Secondary | ICD-10-CM | POA: Diagnosis not present

## 2018-09-04 DIAGNOSIS — S83002D Unspecified subluxation of left patella, subsequent encounter: Secondary | ICD-10-CM | POA: Diagnosis not present

## 2018-09-04 DIAGNOSIS — M25661 Stiffness of right knee, not elsewhere classified: Secondary | ICD-10-CM | POA: Diagnosis not present

## 2018-09-04 DIAGNOSIS — S83001D Unspecified subluxation of right patella, subsequent encounter: Secondary | ICD-10-CM | POA: Diagnosis not present

## 2018-09-09 DIAGNOSIS — M25661 Stiffness of right knee, not elsewhere classified: Secondary | ICD-10-CM | POA: Diagnosis not present

## 2018-09-09 DIAGNOSIS — S83001D Unspecified subluxation of right patella, subsequent encounter: Secondary | ICD-10-CM | POA: Diagnosis not present

## 2018-09-09 DIAGNOSIS — S83002D Unspecified subluxation of left patella, subsequent encounter: Secondary | ICD-10-CM | POA: Diagnosis not present

## 2018-09-12 DIAGNOSIS — M25561 Pain in right knee: Secondary | ICD-10-CM | POA: Diagnosis not present

## 2018-09-12 DIAGNOSIS — M222X1 Patellofemoral disorders, right knee: Secondary | ICD-10-CM | POA: Diagnosis not present

## 2018-09-16 DIAGNOSIS — M25661 Stiffness of right knee, not elsewhere classified: Secondary | ICD-10-CM | POA: Diagnosis not present

## 2018-09-16 DIAGNOSIS — S83001D Unspecified subluxation of right patella, subsequent encounter: Secondary | ICD-10-CM | POA: Diagnosis not present

## 2018-09-16 DIAGNOSIS — S83002D Unspecified subluxation of left patella, subsequent encounter: Secondary | ICD-10-CM | POA: Diagnosis not present

## 2018-09-18 DIAGNOSIS — S83002D Unspecified subluxation of left patella, subsequent encounter: Secondary | ICD-10-CM | POA: Diagnosis not present

## 2018-09-18 DIAGNOSIS — M25661 Stiffness of right knee, not elsewhere classified: Secondary | ICD-10-CM | POA: Diagnosis not present

## 2018-09-18 DIAGNOSIS — S83001D Unspecified subluxation of right patella, subsequent encounter: Secondary | ICD-10-CM | POA: Diagnosis not present

## 2018-09-23 DIAGNOSIS — S83001D Unspecified subluxation of right patella, subsequent encounter: Secondary | ICD-10-CM | POA: Diagnosis not present

## 2018-09-23 DIAGNOSIS — M2201 Recurrent dislocation of patella, right knee: Secondary | ICD-10-CM | POA: Diagnosis not present

## 2018-09-23 DIAGNOSIS — M25661 Stiffness of right knee, not elsewhere classified: Secondary | ICD-10-CM | POA: Diagnosis not present

## 2018-09-23 DIAGNOSIS — M2202 Recurrent dislocation of patella, left knee: Secondary | ICD-10-CM | POA: Diagnosis not present

## 2018-09-23 DIAGNOSIS — S83002D Unspecified subluxation of left patella, subsequent encounter: Secondary | ICD-10-CM | POA: Diagnosis not present

## 2018-09-25 DIAGNOSIS — S83002D Unspecified subluxation of left patella, subsequent encounter: Secondary | ICD-10-CM | POA: Diagnosis not present

## 2018-09-25 DIAGNOSIS — S83001D Unspecified subluxation of right patella, subsequent encounter: Secondary | ICD-10-CM | POA: Diagnosis not present

## 2018-09-25 DIAGNOSIS — M25661 Stiffness of right knee, not elsewhere classified: Secondary | ICD-10-CM | POA: Diagnosis not present

## 2018-09-29 DIAGNOSIS — S83001D Unspecified subluxation of right patella, subsequent encounter: Secondary | ICD-10-CM | POA: Diagnosis not present

## 2018-09-29 DIAGNOSIS — S83002D Unspecified subluxation of left patella, subsequent encounter: Secondary | ICD-10-CM | POA: Diagnosis not present

## 2018-09-29 DIAGNOSIS — M25661 Stiffness of right knee, not elsewhere classified: Secondary | ICD-10-CM | POA: Diagnosis not present

## 2018-10-01 DIAGNOSIS — M25661 Stiffness of right knee, not elsewhere classified: Secondary | ICD-10-CM | POA: Diagnosis not present

## 2018-10-01 DIAGNOSIS — S83002D Unspecified subluxation of left patella, subsequent encounter: Secondary | ICD-10-CM | POA: Diagnosis not present

## 2018-10-01 DIAGNOSIS — S83001D Unspecified subluxation of right patella, subsequent encounter: Secondary | ICD-10-CM | POA: Diagnosis not present

## 2018-10-07 DIAGNOSIS — M25661 Stiffness of right knee, not elsewhere classified: Secondary | ICD-10-CM | POA: Diagnosis not present

## 2018-10-07 DIAGNOSIS — S83001D Unspecified subluxation of right patella, subsequent encounter: Secondary | ICD-10-CM | POA: Diagnosis not present

## 2018-10-07 DIAGNOSIS — S83002D Unspecified subluxation of left patella, subsequent encounter: Secondary | ICD-10-CM | POA: Diagnosis not present

## 2018-10-09 DIAGNOSIS — S83002D Unspecified subluxation of left patella, subsequent encounter: Secondary | ICD-10-CM | POA: Diagnosis not present

## 2018-10-09 DIAGNOSIS — S83001D Unspecified subluxation of right patella, subsequent encounter: Secondary | ICD-10-CM | POA: Diagnosis not present

## 2018-10-09 DIAGNOSIS — M25661 Stiffness of right knee, not elsewhere classified: Secondary | ICD-10-CM | POA: Diagnosis not present

## 2018-10-14 DIAGNOSIS — R5383 Other fatigue: Secondary | ICD-10-CM | POA: Diagnosis not present

## 2018-10-14 DIAGNOSIS — R509 Fever, unspecified: Secondary | ICD-10-CM | POA: Diagnosis not present

## 2018-10-14 DIAGNOSIS — R070 Pain in throat: Secondary | ICD-10-CM | POA: Diagnosis not present

## 2018-10-14 DIAGNOSIS — J029 Acute pharyngitis, unspecified: Secondary | ICD-10-CM | POA: Diagnosis not present

## 2018-10-16 DIAGNOSIS — M25661 Stiffness of right knee, not elsewhere classified: Secondary | ICD-10-CM | POA: Diagnosis not present

## 2018-10-16 DIAGNOSIS — S83002D Unspecified subluxation of left patella, subsequent encounter: Secondary | ICD-10-CM | POA: Diagnosis not present

## 2018-10-16 DIAGNOSIS — S83001D Unspecified subluxation of right patella, subsequent encounter: Secondary | ICD-10-CM | POA: Diagnosis not present

## 2018-10-21 DIAGNOSIS — M25661 Stiffness of right knee, not elsewhere classified: Secondary | ICD-10-CM | POA: Diagnosis not present

## 2018-10-21 DIAGNOSIS — S83001D Unspecified subluxation of right patella, subsequent encounter: Secondary | ICD-10-CM | POA: Diagnosis not present

## 2018-10-21 DIAGNOSIS — S83002D Unspecified subluxation of left patella, subsequent encounter: Secondary | ICD-10-CM | POA: Diagnosis not present

## 2018-10-23 DIAGNOSIS — M25661 Stiffness of right knee, not elsewhere classified: Secondary | ICD-10-CM | POA: Diagnosis not present

## 2018-10-23 DIAGNOSIS — S83001D Unspecified subluxation of right patella, subsequent encounter: Secondary | ICD-10-CM | POA: Diagnosis not present

## 2018-10-23 DIAGNOSIS — S83002D Unspecified subluxation of left patella, subsequent encounter: Secondary | ICD-10-CM | POA: Diagnosis not present

## 2018-11-04 DIAGNOSIS — M25661 Stiffness of right knee, not elsewhere classified: Secondary | ICD-10-CM | POA: Diagnosis not present

## 2018-11-04 DIAGNOSIS — S83002D Unspecified subluxation of left patella, subsequent encounter: Secondary | ICD-10-CM | POA: Diagnosis not present

## 2018-11-04 DIAGNOSIS — S83001D Unspecified subluxation of right patella, subsequent encounter: Secondary | ICD-10-CM | POA: Diagnosis not present

## 2018-11-06 DIAGNOSIS — S83002D Unspecified subluxation of left patella, subsequent encounter: Secondary | ICD-10-CM | POA: Diagnosis not present

## 2018-11-06 DIAGNOSIS — S83001D Unspecified subluxation of right patella, subsequent encounter: Secondary | ICD-10-CM | POA: Diagnosis not present

## 2018-11-06 DIAGNOSIS — M25661 Stiffness of right knee, not elsewhere classified: Secondary | ICD-10-CM | POA: Diagnosis not present

## 2018-11-13 DIAGNOSIS — M2201 Recurrent dislocation of patella, right knee: Secondary | ICD-10-CM | POA: Diagnosis not present

## 2018-11-13 DIAGNOSIS — M2202 Recurrent dislocation of patella, left knee: Secondary | ICD-10-CM | POA: Diagnosis not present

## 2018-11-14 DIAGNOSIS — M25561 Pain in right knee: Secondary | ICD-10-CM | POA: Diagnosis not present

## 2018-11-14 DIAGNOSIS — M2352 Chronic instability of knee, left knee: Secondary | ICD-10-CM | POA: Diagnosis not present

## 2018-11-14 DIAGNOSIS — M24461 Recurrent dislocation, right knee: Secondary | ICD-10-CM | POA: Diagnosis not present

## 2018-11-14 DIAGNOSIS — G8918 Other acute postprocedural pain: Secondary | ICD-10-CM | POA: Diagnosis not present

## 2018-11-14 DIAGNOSIS — M2201 Recurrent dislocation of patella, right knee: Secondary | ICD-10-CM | POA: Diagnosis not present

## 2018-11-14 DIAGNOSIS — M2241 Chondromalacia patellae, right knee: Secondary | ICD-10-CM | POA: Diagnosis not present

## 2018-11-14 HISTORY — PX: OTHER SURGICAL HISTORY: SHX169

## 2018-11-17 DIAGNOSIS — M2201 Recurrent dislocation of patella, right knee: Secondary | ICD-10-CM | POA: Diagnosis not present

## 2018-11-17 DIAGNOSIS — M2202 Recurrent dislocation of patella, left knee: Secondary | ICD-10-CM | POA: Diagnosis not present

## 2018-11-21 ENCOUNTER — Emergency Department (HOSPITAL_COMMUNITY)
Admission: EM | Admit: 2018-11-21 | Discharge: 2018-11-21 | Disposition: A | Discharge status: Home / Self Care | Payer: 59 | Attending: Emergency Medicine | Admitting: Emergency Medicine

## 2018-11-21 ENCOUNTER — Other Ambulatory Visit: Payer: Self-pay

## 2018-11-21 ENCOUNTER — Encounter (HOSPITAL_COMMUNITY): Payer: Self-pay | Admitting: Emergency Medicine

## 2018-11-21 DIAGNOSIS — Z7982 Long term (current) use of aspirin: Secondary | ICD-10-CM | POA: Insufficient documentation

## 2018-11-21 DIAGNOSIS — K5903 Drug induced constipation: Secondary | ICD-10-CM

## 2018-11-21 DIAGNOSIS — K5900 Constipation, unspecified: Secondary | ICD-10-CM | POA: Diagnosis present

## 2018-11-21 DIAGNOSIS — B373 Candidiasis of vulva and vagina: Secondary | ICD-10-CM | POA: Diagnosis not present

## 2018-11-21 MED ORDER — FLUCONAZOLE 150 MG PO TABS: 150.0000 mg | ORAL_TABLET | Freq: Once | ORAL | 0 refills | Status: AC

## 2018-11-21 MED ORDER — BISACODYL 10 MG RE SUPP: 10.0000 mg | RECTAL | 0 refills | Status: DC | PRN

## 2018-11-21 MED ORDER — POLYETHYLENE GLYCOL 3350 17 G PO PACK: 17.0000 g | PACK | Freq: Two times a day (BID) | ORAL | 0 refills | Status: DC

## 2018-11-21 MED ORDER — MAGNESIUM CITRATE PO SOLN: 1.0000 | Freq: Once | ORAL | 0 refills | Status: AC

## 2018-11-21 NOTE — ED Provider Notes (Signed)
[facility]GENCY [facility]MENT Provider Note   CSN: [id number] Arrival date & time: 11/21/18  1135     History   Chief Complaint Chief Complaint  Patient presents with  . Constipation    HPI Diana Sandoval is a 17 y.o. female.  HPI Patient presents to the emergency room for evaluation of constipation.  Patient states she had [place] on [place]  She was prescribed oxycodone for pain.  Patient states since that time she has not had a normal bowel movement.  In fact it has been 11 days since she is had a normal bowel movement.  She has had some intermittent sharp pains on the left side of her abdomen but none currently.  Patient has also had some rectal pressure.  She tried taking Ex-Lax and Dulcolax without relief. Past Medical History:  Diagnosis Date  . Anxiety     There are no active problems to display for this patient.   Past Surgical History:  Procedure Laterality Date  . KNEE SURGERY       OB History    Gravida  0   Para  0   Term  0   Preterm  0   AB  0   Living  0     SAB  0   TAB  0   Ectopic  0   Multiple  0   Live Births  0            Home Medications    Prior to Admission medications   Medication Sig Start Date End Date Taking? Authorizing Provider  acetaminophen (TYLENOL) 500 MG tablet Take 500 mg by mouth every 6 (six) hours as needed for moderate pain.   Yes [provider]  aspirin 325 MG tablet Take 325 mg by mouth daily.   Yes [provider]  docusate sodium (COLACE) 100 MG capsule Take 100 mg by mouth 2 (two) times daily.   Yes [provider]  drospirenone-ethinyl estradiol (YAZ) 3-0.02 MG tablet Take 1 tablet by mouth daily. 07/15/18  Yes Harrington ChallengerYoung, Nancy J, NP  ibuprofen (ADVIL,MOTRIN) 400 MG tablet Take 400 mg by mouth every 6 (six) hours as needed.   Yes [provider]  loratadine (CLARITIN) 10 MG tablet Take 1 tablet (10 mg total) by mouth daily.  11/21/14  Yes Piepenbrink, Victorino DikeJennifer, PA-C  Melatonin 3 MG TABS Take 1 tablet by mouth at bedtime.   Yes [provider]  Miconazole Nitrate (MONISTAT 3 VA) Place 1 application vaginally daily.   Yes [provider]  ondansetron (ZOFRAN) 4 MG tablet Take 4 mg by mouth every 8 (eight) hours as needed for nausea or vomiting.   Yes [provider]  oxyCODONE (OXY IR/ROXICODONE) 5 MG immediate release tablet Take 5 mg by mouth every 4 (four) hours as needed for severe pain.   Yes [provider]  bisacodyl (DULCOLAX) 10 MG suppository Place 1 suppository (10 mg total) rectally as needed for moderate constipation. 11/21/18   Linwood DibblesKnapp, Jalene Demo, MD  fluconazole (DIFLUCAN) 150 MG tablet Take 1 tablet (150 mg total) by mouth once for 1 dose. 11/21/18 11/21/18  Linwood DibblesKnapp, Vernel Langenderfer, MD  magnesium citrate SOLN Take 296 mLs (1 Bottle total) by mouth once for 1 dose. For sever constipation 11/21/18 11/21/18  Linwood DibblesKnapp, Autry Prust, MD  polyethylene glycol [facility](MIRALAX) packet Take 17 g by mouth 2 (two) times daily. For constipation 11/21/18   Linwood DibblesKnapp, Edwardo Wojnarowski, MD    Family History  Family History  Problem Relation Age of Onset  . Hypertension Mother     Social History Social History   Tobacco Use  . Smoking status: Never Smoker  . Smokeless tobacco: Never Used  Substance Use Topics  . Alcohol use: Never    Frequency: Never  . Drug use: Never     Allergies   Patient has no known allergies.   Review of Systems Review of Systems  All other systems reviewed and are negative.    Physical Exam Updated Vital Signs BP 124/86 (BP Location: Right Arm)   Pulse 89   Temp 98.2 F (36.8 C) (Oral)   Resp 14   Ht 1.702 m (5\' 7" )   Wt 64.9 kg   LMP 10/31/2018   SpO2 100%   BMI 22.40 kg/m   Physical Exam Vitals signs and nursing note reviewed.  Constitutional:      General: She is not in acute distress.    Appearance: She is well-developed.  HENT:     Head: Normocephalic and atraumatic.      Right Ear: External ear normal.     Left Ear: External ear normal.  Eyes:     General: No scleral icterus.       Right eye: No discharge.        Left eye: No discharge.     Conjunctiva/sclera: Conjunctivae normal.  Neck:     Musculoskeletal: Neck supple.     Trachea: No tracheal deviation.  Cardiovascular:     Rate and Rhythm: Normal rate.  Pulmonary:     Effort: Pulmonary effort is normal. No respiratory distress.     Breath sounds: No stridor.  Abdominal:     General: There is no distension.  Genitourinary:    Comments: Normal rectal tone, no fecal impaction Skin:    General: Skin is warm and dry.     Findings: No rash.  Neurological:     Mental Status: She is alert.     Cranial Nerves: Cranial nerve deficit: no gross deficits.      ED Treatments / Results  Labs (all labs ordered are listed, but only abnormal results are displayed) Labs Reviewed - No data to display  EKG None  Radiology No results found.  Procedures Procedures (including critical care time)  Medications Ordered in ED Medications - No data to display   Initial Impression / Assessment and Plan / ED Course  I have reviewed the triage vital signs and the nursing notes.  Pertinent labs & imaging results that were available during my care of the patient were reviewed by me and considered in my medical decision making (see chart for details).   Patient presents emerged with complaints of constipation.  Her abdomen is benign.  No guarding.  She is not having any nausea or vomiting.  No symptoms to suggest obstruction.  Plan on discharge home with prescription for laxative.  Warning signs and precautions discussed.  Patient also is complaining of a yeast infection.  She was told by her surgeon to take Monistat but that is not helping. Final Clinical Impressions(s) / ED Diagnoses   Final diagnoses:  Drug-induced constipation    ED Discharge Orders         Ordered    bisacodyl (DULCOLAX) 10 MG  suppository  As needed     11/21/18 1458    polyethylene glycol (MIRALAX) packet  2 times daily     11/21/18 1458    magnesium citrate SOLN   Once  11/21/18 1458    fluconazole (DIFLUCAN) 150 MG tablet   Once     11/21/18 1459           Linwood DibblesKnapp, Rania Prothero, MD 11/21/18 1459

## 2018-11-21 NOTE — Discharge Instructions (Signed)
Take the Dulcolax and the MiraLAX today for the constipation.  If that does not start working you can add the magnesium citrate tomorrow.

## 2018-11-21 NOTE — ED Triage Notes (Signed)
Patient states she had right knee surgery last week and has not had a bowel movement in 11 days. States she was placed on pain medication after surgery but did not have bowel movement for 4 days prior to surgery. Complaining of abdominal pain and rectal pressure.

## 2018-11-24 DIAGNOSIS — S83002D Unspecified subluxation of left patella, subsequent encounter: Secondary | ICD-10-CM | POA: Diagnosis not present

## 2018-11-24 DIAGNOSIS — M25661 Stiffness of right knee, not elsewhere classified: Secondary | ICD-10-CM | POA: Diagnosis not present

## 2018-11-24 DIAGNOSIS — S83001D Unspecified subluxation of right patella, subsequent encounter: Secondary | ICD-10-CM | POA: Diagnosis not present

## 2018-11-28 ENCOUNTER — Encounter (HOSPITAL_COMMUNITY): Payer: Self-pay

## 2018-11-28 ENCOUNTER — Other Ambulatory Visit: Payer: Self-pay

## 2018-11-28 ENCOUNTER — Emergency Department (HOSPITAL_COMMUNITY): Payer: 59

## 2018-11-28 ENCOUNTER — Emergency Department (HOSPITAL_COMMUNITY)
Admission: EM | Admit: 2018-11-28 | Discharge: 2018-11-28 | Disposition: A | Discharge status: Home / Self Care | Payer: 59 | Attending: Emergency Medicine | Admitting: Emergency Medicine

## 2018-11-28 DIAGNOSIS — Z79899 Other long term (current) drug therapy: Secondary | ICD-10-CM | POA: Diagnosis not present

## 2018-11-28 DIAGNOSIS — R109 Unspecified abdominal pain: Secondary | ICD-10-CM | POA: Diagnosis not present

## 2018-11-28 DIAGNOSIS — K5641 Fecal impaction: Secondary | ICD-10-CM | POA: Diagnosis not present

## 2018-11-28 DIAGNOSIS — K5900 Constipation, unspecified: Secondary | ICD-10-CM | POA: Diagnosis not present

## 2018-11-28 LAB — POC URINE PREG, ED: Preg Test, Ur: NEGATIVE

## 2018-11-28 MED ORDER — PEG 3350-KCL-NABCB-NACL-NASULF 236 G PO SOLR: ORAL | 0 refills | Status: DC

## 2018-11-28 MED ORDER — ONDANSETRON HCL 4 MG/2ML IJ SOLN
4.0000 mg | Freq: Once | INTRAMUSCULAR | Status: AC
  Administered 2018-11-28: 4 mg via INTRAVENOUS
  Filled 2018-11-28: qty 2

## 2018-11-28 MED ORDER — HYDROMORPHONE HCL 1 MG/ML IJ SOLN
1.0000 mg | Freq: Once | INTRAMUSCULAR | Status: AC
  Administered 2018-11-28: 1 mg via INTRAVENOUS
  Filled 2018-11-28: qty 1

## 2018-11-28 NOTE — ED Triage Notes (Signed)
Pt reports she is unable to pass stool. Increase in rectal pressure. Pts last BM last Saturday. Pt states she has some blood but stool will not come out. Seen 11/21/18

## 2018-11-28 NOTE — Discharge Instructions (Signed)
Follow-up with the gastroenterologist Dr. Darrick Pennafields here in [place] if needed

## 2018-11-28 NOTE — ED Provider Notes (Signed)
[facility] Provider Note   CSN: [id number] Arrival date & time: 11/28/18  1610     History   Chief Complaint Chief Complaint  Patient presents with  . Fecal Impaction    HPI Diana Sandoval is a 17 y.o. female.  Patient states she is constipated because she been taking pain medicine.  The history is provided by the patient. No language interpreter was used.  Abdominal Pain   This is a new problem. The current episode started more than 2 days ago. The problem occurs constantly. The problem has not changed since onset.The pain is associated with an unknown factor. The pain is located in the generalized abdominal region. The pain is at a severity of 4/10. The pain is moderate. Associated symptoms include constipation. Pertinent negatives include diarrhea, frequency, hematuria and headaches.    Past Medical History:  Diagnosis Date  . Anxiety     There are no active problems to display for this patient.   Past Surgical History:  Procedure Laterality Date  . KNEE SURGERY       OB History    Gravida  0   Para  0   Term  0   Preterm  0   AB  0   Living  0     SAB  0   TAB  0   Ectopic  0   Multiple  0   Live Births  0            Home Medications    Prior to Admission medications   Medication Sig Start Date End Date Taking? Authorizing Provider  acetaminophen (TYLENOL) 500 MG tablet Take 500 mg by mouth every 6 (six) hours as needed for moderate pain.   Yes [provider]  aspirin 325 MG tablet Take 325 mg by mouth daily.   Yes [provider]  docusate sodium (COLACE) 100 MG capsule Take 100 mg by mouth 2 (two) times daily.   Yes [provider]  drospirenone-ethinyl estradiol (YAZ) 3-0.02 MG tablet Take 1 tablet by mouth daily. 07/15/18  Yes Harrington ChallengerYoung, Nancy J, NP  ibuprofen (ADVIL,MOTRIN) 400 MG tablet Take 400 mg by mouth every 6 (six) hours as needed.   Yes [provider]  Melatonin 3 MG  TABS Take 1 tablet by mouth at bedtime.   Yes [provider]  oxyCODONE (OXY IR/ROXICODONE) 5 MG immediate release tablet Take 5 mg by mouth every 4 (four) hours as needed for severe pain.   Yes [provider]  polyethylene glycol (MIRALAX) packet Take 17 g by mouth 2 (two) times daily. For constipation 11/21/18  Yes Linwood DibblesKnapp, Jon, MD  bisacodyl (DULCOLAX) 10 MG suppository Place 1 suppository (10 mg total) rectally as needed for moderate constipation. Patient not taking: Reported on 11/28/2018 11/21/18   Linwood DibblesKnapp, Jon, MD  loratadine (CLARITIN) 10 MG tablet Take 1 tablet (10 mg total) by mouth daily. Patient not taking: Reported on 11/28/2018 11/21/14   Piepenbrink, Victorino DikeJennifer, PA-C  ondansetron (ZOFRAN) 4 MG tablet Take 4 mg by mouth every 8 (eight) hours as needed for nausea or vomiting.    [provider]  polyethylene glycol (GOLYTELY) 236 g solution Drink one 8 ounce glass every hour until you have a bowel movement 11/28/18   Bethann BerkshireZammit, Koren Sermersheim, MD    Family History Family History  Problem Relation Age of Onset  . Hypertension Mother     Social History Social History   Tobacco Use  . Smoking  status: Never Smoker  . Smokeless tobacco: Never Used  Substance Use Topics  . Alcohol use: Never    Frequency: Never  . Drug use: Never     Allergies   Patient has no known allergies.   Review of Systems Review of Systems  Constitutional: Negative for appetite change and fatigue.  HENT: Negative for congestion, ear discharge and sinus pressure.   Eyes: Negative for discharge.  Respiratory: Negative for cough.   Cardiovascular: Negative for chest pain.  Gastrointestinal: Positive for abdominal pain and constipation. Negative for diarrhea.  Genitourinary: Negative for frequency and hematuria.  Musculoskeletal: Negative for back pain.  Skin: Negative for rash.  Neurological: Negative for seizures and headaches.  Psychiatric/Behavioral: Negative for  hallucinations.     Physical Exam Updated Vital Signs BP 111/87   Pulse 69   Temp 97.7 F (36.5 C) (Temporal)   Resp 17   Ht 5\' 7"  (1.702 m)   Wt 64.4 kg   LMP 10/31/2018   SpO2 100%   BMI 22.24 kg/m   Physical Exam Constitutional:      Appearance: She is well-developed.  HENT:     Head: Normocephalic.     Nose: Nose normal.  Eyes:     General: No scleral icterus.    Conjunctiva/sclera: Conjunctivae normal.  Neck:     Musculoskeletal: Neck supple.     Thyroid: No thyromegaly.  Cardiovascular:     Rate and Rhythm: Normal rate and regular rhythm.     Heart sounds: No murmur. No friction rub. No gallop.   Pulmonary:     Breath sounds: No stridor. No wheezing or rales.  Chest:     Chest wall: No tenderness.  Abdominal:     General: There is no distension.     Tenderness: There is no abdominal tenderness. There is no rebound.  Musculoskeletal: Normal range of motion.  Lymphadenopathy:     Cervical: No cervical adenopathy.  Skin:    Findings: No erythema or rash.  Neurological:     Mental Status: She is alert and oriented to person, place, and time.     Motor: No abnormal muscle tone.     Coordination: Coordination normal.  Psychiatric:        Behavior: Behavior normal.      ED Treatments / Results  Labs (all labs ordered are listed, but only abnormal results are displayed) Labs Reviewed  PREGNANCY, URINE  POC URINE PREG, ED    EKG None  Radiology Dg Abd Acute W/chest  Result Date: 11/28/2018 CLINICAL DATA:  Constipation EXAM: DG ABDOMEN ACUTE W/ 1V CHEST COMPARISON:  October 20, 2016 chest x-ray FINDINGS: There is no evidence of dilated bowel loops or free intraperitoneal air. Extensive bowel content is identified throughout colon. There is scoliosis of spine. No radiopaque calculi or other significant radiographic abnormality is seen. Heart size and mediastinal contours are within normal limits. Both lungs are clear. IMPRESSION: No bowel obstruction.   Constipation. No acute cardiopulmonary disease. Electronically Signed   By: Sherian ReinWei-Chen  Lin M.D.   On: 11/28/2018 20:15    Procedures Procedures (including critical care time)  Medications Ordered in ED Medications  HYDROmorphone (DILAUDID) injection 1 mg (1 mg Intravenous Given 11/28/18 1924)  ondansetron (ZOFRAN) injection 4 mg (4 mg Intravenous Given 11/28/18 1924)  HYDROmorphone (DILAUDID) injection 1 mg (1 mg Intravenous Given 11/28/18 2157)  ondansetron (ZOFRAN) injection 4 mg (4 mg Intravenous Given 11/28/18 2157)     Initial Impression / Assessment and Plan /  ED Course  I have reviewed the triage vital signs and the nursing notes.  Pertinent labs & imaging results that were available during my care of the patient were reviewed by me and considered in my medical decision making (see chart for details).    X-ray shows constipation.  Patient given enema which has helped significantly.  Patient will be discharged home with GoLYTELY if she is constipated more.  She is instructed to drink one 8 ounce glass every hour until she has a bowel movement.  Final Clinical Impressions(s) / ED Diagnoses   Final diagnoses:  Constipation, unspecified constipation type    ED Discharge Orders         Ordered    polyethylene glycol (GOLYTELY) 236 g solution     11/28/18 2252           Bethann Berkshire, MD 11/28/18 2255

## 2018-12-01 ENCOUNTER — Encounter: Payer: Self-pay | Admitting: Physical Therapy

## 2018-12-01 ENCOUNTER — Other Ambulatory Visit: Payer: Self-pay

## 2018-12-01 ENCOUNTER — Ambulatory Visit: Payer: 59 | Attending: Orthopedic Surgery | Admitting: Physical Therapy

## 2018-12-01 DIAGNOSIS — M6281 Muscle weakness (generalized): Secondary | ICD-10-CM

## 2018-12-01 DIAGNOSIS — M25661 Stiffness of right knee, not elsewhere classified: Secondary | ICD-10-CM | POA: Diagnosis not present

## 2018-12-01 DIAGNOSIS — M25561 Pain in right knee: Secondary | ICD-10-CM | POA: Diagnosis not present

## 2018-12-01 DIAGNOSIS — R262 Difficulty in walking, not elsewhere classified: Secondary | ICD-10-CM | POA: Insufficient documentation

## 2018-12-01 NOTE — Therapy (Signed)
[facility] [address] [place], [place], [address] Phone: [phone]   Fax:  [phone]  Physical Therapy Evaluation  Patient Details  Name: Diana Sandoval MRN: [id number] Date of Birth: 2001/07/15 Referring Provider (PT): Doretha Sou, MD   Encounter Date: 12/01/2018  PT End of Session - 12/01/18 1635    Visit Number  1    Number of Visits  4    Date for PT Re-Evaluation  12/22/18    PT Start Time  1300    PT Stop Time  1338    PT Time Calculation (min)  38 min    Activity Tolerance  Patient tolerated treatment well;Patient limited by pain    Behavior During Therapy  Anxious       Past Medical History:  Diagnosis Date  . Anxiety     Past Surgical History:  Procedure Laterality Date  . KNEE SURGERY    . medial patellofemoral ligament reconstruction Right 11/14/2018    There were no vitals filed for this visit.   Subjective Assessment - 12/01/18 1630    Subjective  Patient arrives to physical therapy with mother with right knee pain, stiffness and difficulty walking secondary to right Medial Patellofemoral Ligament reconstruction on 11/14/18. Patient requires assistance for transitioning especially with bringing R LE on/off bed. Patient reports being compliant with HEP provided by surgeon [facility] sets, sidelying hip flexion/extension, and closed chain knee flexion while supported with crutches). Patient reports tightness and stiffness with movement especially with end range flexion. Patient reports pain at worst is 6/10 and pain at best is 3/10 with Tylenol PRN. Patient's goals are to decrease pain, improve walking, improve movement, and improve strength to perform work and home activities.    Patient is accompained by:  Family member   Mother   Pertinent History  right MPFL reconstruction allograft 11/14/18; history of multiple subluxations, panic attacks    Limitations  House hold activities;Walking    Diagnostic tests   x-ray    Patient Stated Goals  walk normally, bend knee normally    Currently in Pain?  Yes    Pain Score  3     Pain Location  Knee    Pain Orientation  Right    Pain Descriptors / Indicators  Tightness;Sore    Pain Type  Surgical pain    Pain Onset  1 to 4 weeks ago    Pain Frequency  Constant    Aggravating Factors   excessive walking, bending the knee    Pain Relieving Factors  Tylenol, stretching    Effect of Pain on Daily Activities  hard to walk, sore when walking         [facility] PT Assessment - 12/01/18 0001      Assessment   Medical Diagnosis  S/P MPFL reconstruction allograft    Referring Provider (PT)  Doretha Sou, MD    Onset Date/Surgical Date  11/14/18    Next MD Visit  Dec 08, 2018    Prior Therapy  yes      Precautions   Precautions  Knee    Precaution Comments  able to ambulate without brace    Required Braces or Orthoses  Other Brace/Splint      Restrictions   Weight Bearing Restrictions  Yes    RLE Weight Bearing  Weight bearing as tolerated   with brace per referral     Balance Screen   Has the patient fallen in the past 6 months  No    Has the patient had a decrease in activity level because of a fear of falling?   Yes    Is the patient reluctant to leave their home because of a fear of falling?   No      Home Nurse, mental healthnvironment   Living Environment  Private residence    Living Arrangements  Parent    Available Help at Discharge  Family      Prior Function   Level of Independence  Independent with basic ADLs    Vocation  Student      Observation/Other Assessments   Observations  ace bandage wrapped with gauze over incisions; incisions healing well with steristrips intact on larger incision      Observation/Other Assessments-Edema    Edema  Circumferential      Circumferential Edema   Circumferential - Right  37.5 cm at mid patella    Circumferential - Left   35 cm at mid patella      ROM / Strength   AROM / PROM / Strength  PROM      AROM    AROM Assessment Site  Knee    Right/Left Knee  Right    Right Knee Extension  0    Right Knee Flexion  50      PROM   Overall PROM Comments  very guarded with PROM flexion    PROM Assessment Site  Knee    Right/Left Knee  Right    Right Knee Extension  0    Right Knee Flexion  56      Strength   Overall Strength  Deficits    Overall Strength Comments  unable to perform SLR at this time      Transfers   Transfers  Supine to Sit;Sit to Supine    Supine to Sit  4: Min assist    Sit to Supine  4: Min assist    Comments  requires assistance at right LE to raise and lower leg on/off plinth      Ambulation/Gait   Gait Pattern  Step-to pattern;Decreased step length - left;Decreased stance time - right;Decreased stride length;Decreased hip/knee flexion - right;Decreased weight shift to right;Right circumduction;Right hip hike;Right foot flat;Antalgic                Objective measurements completed on examination: See above findings.              PT Education - 12/01/18 1635    Education Details  Quad sets, glute sets, heel slides, elevation and icing    Person(s) Educated  Patient;Parent(s)    Methods  Explanation;Demonstration;Handout    Comprehension  Verbalized understanding;Returned demonstration          PT Long Term Goals - 12/01/18 1637      PT LONG TERM GOAL #1   Title  Patient will be independent with HEP    Time  2    Period  Weeks    Status  New      PT LONG TERM GOAL #2   Title  Patient will demonstrate ability to perform 3 SLR independently to improve right quadriceps strength.    Time  2    Period  Weeks    Status  New      PT LONG TERM GOAL #3   Title  Patient will demonstrate 90+ degrees of right knee flexion AROM to improve ability to perform functional tasks.    Time  2    Period  Weeks    Status  New             Plan - 12/01/18 1636    Clinical Impression Statement  Patient is a 17 year old female who presents to  physical therapy with right knee pain, decreased ROM, and difficulty walking secondary to right MPFL reconstruction. Patient noted with moderate ecchymosis in right knee with right quadriceps atrophy in comparison to unaffected left LE. Patient ambulates without her brace with decreased right stance time, decreased left step length, decreased right knee flexion during swing, right LE circumduction and right hip hike. Patient educated on importance of icing and elevating to address edema. Patient and mother reported understanding. [place]; patient educated the importance of HEP to optimize therapy. Patient reported understanding; patient would benefit from skilled physical therapy to address deficits and address patient's goals.     Clinical Presentation  Stable    Clinical Decision Making  Low    Rehab Potential  Good    PT Frequency  2x / week    PT Duration  2 weeks    PT Treatment/Interventions  Cryotherapy;Microbiologistlectrical Stimulation;Balance training;Therapeutic exercise;Gait training;Stair training;Neuromuscular re-education;Patient/family education;Vasopneumatic Device;Taping;Manual techniques;Passive range of motion    PT Next Visit Plan  Nustep, AAROM exercises, hip and quad strengthening, VMS, e-stim/vaso for pain relief and edema control     PT Home Exercise Plan  see patient education    Consulted and Agree with Plan of Care  Patient    Family Member Consulted  Mother       Patient will benefit from skilled therapeutic intervention in order to improve the following deficits and impairments:  Abnormal gait, Pain, Decreased activity tolerance, Decreased endurance, Decreased range of motion, Decreased strength, Decreased balance, Difficulty walking, Increased edema  Visit Diagnosis: Acute pain of right knee - Plan: PT plan of care cert/re-cert  Stiffness of right knee, not elsewhere classified - Plan: PT plan of care cert/re-cert  Muscle weakness  (generalized) - Plan: PT plan of care cert/re-cert  Difficulty in walking, not elsewhere classified - Plan: PT plan of care cert/re-cert     Problem List There are no active problems to display for this patient.  Guss BundeKrystle Ameka Krigbaum, PT, DPT 12/01/2018, 4:47 PM  [facility] [address] [place], [place], [address] Phone: [phone]   Fax:  [phone]  Name: Diana Sandoval MRN: [id number] Date of Birth: 2001-07-23

## 2018-12-04 ENCOUNTER — Ambulatory Visit: Payer: 59 | Attending: Orthopedic Surgery | Admitting: *Deleted

## 2018-12-04 DIAGNOSIS — M25561 Pain in right knee: Secondary | ICD-10-CM | POA: Diagnosis present

## 2018-12-04 DIAGNOSIS — M6281 Muscle weakness (generalized): Secondary | ICD-10-CM | POA: Insufficient documentation

## 2018-12-04 DIAGNOSIS — M25661 Stiffness of right knee, not elsewhere classified: Secondary | ICD-10-CM | POA: Insufficient documentation

## 2018-12-04 DIAGNOSIS — G8929 Other chronic pain: Secondary | ICD-10-CM | POA: Insufficient documentation

## 2018-12-04 DIAGNOSIS — R262 Difficulty in walking, not elsewhere classified: Secondary | ICD-10-CM | POA: Diagnosis present

## 2018-12-04 NOTE — Therapy (Signed)
[facility] [address] [place], [place], [address] Phone: [phone]   Fax:  [phone]  Physical Therapy Treatment  Patient Details  Name: Diana FleetingSydnee G Sandoval MRN: [id number] Date of Birth: [birth year] Referring Provider (PT): Doretha Souale Boyd Jr, MD   Encounter Date: 12/04/2018  PT End of Session - 12/04/18 1319    Visit Number  2    Number of Visits  4    Date for PT Re-Evaluation  12/22/18    PT Start Time  1300    PT Stop Time  1400    PT Time Calculation (min)  60 min       Past Medical History:  Diagnosis Date  . Anxiety     Past Surgical History:  Procedure Laterality Date  . KNEE SURGERY    . medial patellofemoral ligament reconstruction Right 11/14/2018    There were no vitals filed for this visit.  Subjective Assessment - 12/04/18 1320    Pain Location  Knee    Pain Orientation  Right    Pain Descriptors / Indicators  Tightness                       [facility] Adult PT Treatment/Exercise - 12/04/18 0001      Knee/Hip Exercises: Aerobic   Stationary Bike  Level 2 x 15 mins seat 12,611minutes for ROM flexion to      Knee/Hip Exercises: Supine   Quad Sets  AROM;Both;2 sets;10 reps    Straight Leg Raises  AAROM;Right      Knee/Hip Exercises: Sidelying   Hip ABduction  AAROM;Right      Modalities   Modalities  Vasopneumatic      Vasopneumatic   Number Minutes Vasopneumatic   15 minutes    Vasopnuematic Location   Knee    Vasopneumatic Pressure  Low    Vasopneumatic Temperature   36      Manual Therapy   Manual Therapy  Passive ROM    Passive ROM  PROM for flexion to 72 degrees, AAROM SLR and ABDution 5 x10                  PT Long Term Goals - 12/01/18 1637      PT LONG TERM GOAL #1   Title  Patient will be independent with HEP    Time  2    Period  Weeks    Status  New      PT LONG TERM GOAL #2   Title  Patient will demonstrate ability to perform 3 SLR independently to improve  right quadriceps strength.    Time  2    Period  Weeks    Status  New      PT LONG TERM GOAL #3   Title  Patient will demonstrate 90+ degrees of right knee flexion AROM to improve ability to perform functional tasks.    Time  2    Period  Weeks    Status  New            Plan - 12/04/18 1419    Clinical Impression Statement  Pt arrived today walking without brace. She reports having a hard time with RROM and SLR's. She did well on th nustep for ROM reaching 72 degrees of flexion today. Pt still needing assistance with  SLR and hip abduction. Advised Pt to wear brace while performing SLR. Sitting flexion stretch for HEP as well as SLR's.  Normal VASO response  today.    Clinical Presentation  Stable    Clinical Decision Making  Low    Rehab Potential  Good    Clinical Impairments Affecting Rehab Potential  fear of movement, panic attacks,    PT Frequency  2x / week    PT Duration  2 weeks    PT Treatment/Interventions  Cryotherapy;Microbiologistlectrical Stimulation;Balance training;Therapeutic exercise;Gait training;Stair training;Neuromuscular re-education;Patient/family education;Vasopneumatic Device;Taping;Manual techniques;Passive range of motion    PT Next Visit Plan  Nustep, AAROM exercises, hip and quad strengthening, VMS, e-stim/vaso for pain relief and edema control     PT Home Exercise Plan  see patient education    Consulted and Agree with Plan of Care  Patient       Patient will benefit from skilled therapeutic intervention in order to improve the following deficits and impairments:  Abnormal gait, Pain, Decreased activity tolerance, Decreased endurance, Decreased range of motion, Decreased strength, Decreased balance, Difficulty walking, Increased edema  Visit Diagnosis: Acute pain of right knee  Stiffness of right knee, not elsewhere classified  Muscle weakness (generalized)  Difficulty in walking, not elsewhere classified  Chronic pain of right knee     Problem  List There are no active problems to display for this patient.   ,CHRIS, PTA 12/04/2018, 2:24 PM  [facility] [address] [place], [place], [address] Phone: [phone]   Fax:  [phone]  Name: Diana FleetingSydnee G Sandoval MRN: [id number] Date of Birth: 2001/08/11

## 2018-12-08 DIAGNOSIS — S83002D Unspecified subluxation of left patella, subsequent encounter: Secondary | ICD-10-CM | POA: Diagnosis not present

## 2018-12-08 DIAGNOSIS — M25661 Stiffness of right knee, not elsewhere classified: Secondary | ICD-10-CM | POA: Diagnosis not present

## 2018-12-08 DIAGNOSIS — S83001D Unspecified subluxation of right patella, subsequent encounter: Secondary | ICD-10-CM | POA: Diagnosis not present

## 2018-12-09 ENCOUNTER — Encounter: Payer: Self-pay | Admitting: Women's Health

## 2018-12-09 ENCOUNTER — Ambulatory Visit (INDEPENDENT_AMBULATORY_CARE_PROVIDER_SITE_OTHER): Payer: 59 | Admitting: Women's Health

## 2018-12-09 ENCOUNTER — Ambulatory Visit: Payer: 59 | Admitting: *Deleted

## 2018-12-09 VITALS — BP 118/80

## 2018-12-09 DIAGNOSIS — F419 Anxiety disorder, unspecified: Secondary | ICD-10-CM

## 2018-12-09 DIAGNOSIS — M25561 Pain in right knee: Secondary | ICD-10-CM | POA: Diagnosis not present

## 2018-12-09 DIAGNOSIS — M6281 Muscle weakness (generalized): Secondary | ICD-10-CM

## 2018-12-09 DIAGNOSIS — R262 Difficulty in walking, not elsewhere classified: Secondary | ICD-10-CM

## 2018-12-09 DIAGNOSIS — M25661 Stiffness of right knee, not elsewhere classified: Secondary | ICD-10-CM

## 2018-12-09 DIAGNOSIS — G8929 Other chronic pain: Secondary | ICD-10-CM

## 2018-12-09 MED ORDER — ESCITALOPRAM OXALATE 10 MG PO TABS: 10.0000 mg | ORAL_TABLET | Freq: Every day | ORAL | 1 refills | Status: DC

## 2018-12-09 NOTE — Progress Notes (Signed)
18 year old S [place] G0,  [place] struggling with anxiety mostly and some depression.  Has been seeing therapist on a regular basis at [facility] who did recommend medication.  States is afraid to start medication due to weight gain, has lost weight in the past and would like to continue with weight maintenance.  States cries most days, no feelings of harming self or others.  States counseling has helped some but does agree may need medication.  States has numerous family members including both parents and brother,  grandparents with anxiety/depression.  Reports Christmas break was not fun, Christmas dinner actually canceled due to family drama.  Denies any alcohol or unprescribed drug abuse.  Student at [employer] work doing well academically.  Monthly cycle on Yaz, not sexually active, intercourse x1 only with condom .  Exam: Appears well, walking with crutches did have knee surgery recently.  Well-groomed, somewhat emotional when describing how she was feeling.  Anxiety with depression  Plan: Options reviewed, strongly encouraged to continue counseling, self-care, leisure activities reviewed, importance of regular and routine bedtime and exercise encouraged.  Medications reviewed, will try Lexapro 10 mg will start with half tablet reviewed importance of watching calories/diet.  Reviewed it is a low dose, will call if no relief.  Reviewed importance of continuing no alcohol.  Safe campus  behavior reviewed.

## 2018-12-09 NOTE — Therapy (Signed)
[facility] [address] [place], [place], [address] Phone: [phone]   Fax:  [phone]  Physical Therapy Treatment  Patient Details  Name: Diana Sandoval MRN: [id number] Date of Birth: Mar 13, 2001 Referring Provider (PT): Doretha Souale Boyd Jr, MD   Encounter Date: 12/09/2018  PT End of Session - 12/09/18 1446    Visit Number  3    Number of Visits  4    Date for PT Re-Evaluation  12/22/18    PT Start Time  1430    PT Stop Time  1528    PT Time Calculation (min)  58 min    Behavior During Therapy  Anxious       Past Medical History:  Diagnosis Date  . Anxiety     Past Surgical History:  Procedure Laterality Date  . KNEE SURGERY    . medial patellofemoral ligament reconstruction Right 11/14/2018    There were no vitals filed for this visit.  Subjective Assessment - 12/09/18 1443    Subjective  Went to MD yesterday and he was pleased with status. He wanted me to use the crutches to practice gait.    Patient is accompained by:  Family member    Pertinent History  right MPFL reconstruction allograft 11/14/18; history of multiple subluxations, panic attacks    Limitations  House hold activities;Walking    Diagnostic tests  x-ray    Patient Stated Goals  walk normally, bend knee normally    Currently in Pain?  Yes    Pain Score  3     Pain Location  Knee    Pain Orientation  Right    Pain Descriptors / Indicators  Tightness    Pain Type  Surgical pain    Pain Onset  1 to 4 weeks ago    Pain Frequency  Intermittent                       [facility] Adult PT Treatment/Exercise - 12/09/18 0001      Exercises   Exercises  Knee/Hip      Knee/Hip Exercises: Aerobic   Stationary Bike  Level 1 x 8 mins seat 6      Knee/Hip Exercises: Supine   Quad Sets  AROM;Right   with VMS x 10 mins   Straight Leg Raises  AAROM;Right      Knee/Hip Exercises: Sidelying   Hip ABduction  AAROM;Right      Modalities   Modalities   Vasopneumatic;Electrical Stimulation      Electrical Stimulation   Electrical Stimulation Location  RT VMO  VMS x 10 mins 10 secs on/off with quad sets    Electrical Stimulation Goals  Tone      Vasopneumatic   Number Minutes Vasopneumatic   15 minutes    Vasopnuematic Location   Knee    Vasopneumatic Pressure  Low    Vasopneumatic Temperature   36      Manual Therapy   Manual Therapy  Passive ROM    Manual therapy comments  sitting at edge of plinth for fleion ROM, Very guarded.    Passive ROM  PROM for flexion , AAROM SLR and ABDution 5 x10      Gait with bil crutches heel-toe pattern            PT Long Term Goals - 12/01/18 1637      PT LONG TERM GOAL #1   Title  Patient will be independent with HEP  Time  2    Period  Weeks    Status  New      PT LONG TERM GOAL #2   Title  Patient will demonstrate ability to perform 3 SLR independently to improve right quadriceps strength.    Time  2    Period  Weeks    Status  New      PT LONG TERM GOAL #3   Title  Patient will demonstrate 90+ degrees of right knee flexion AROM to improve ability to perform functional tasks.    Time  2    Period  Weeks    Status  New            Plan - 12/09/18 1540    Clinical Impression Statement  Pt arrived today after MD f/u and reports that he was pleased with status and wants her to continue working on gait, ROM, and muscle activation. She did better today with gait with crutches focusing on heel-toe gait pattern. VMS was used to facilitate VMO during quad sets with mild activation. Pt still unable to sit on plinth and let RT knee relax into flexion without hip hiking due to pain and apprehensiveness.  Normal modality response today    Clinical Presentation  Stable    Rehab Potential  Good    Clinical Impairments Affecting Rehab Potential  fear of movement, panic attacks,    PT Frequency  2x / week    PT Duration  2 weeks    PT Treatment/Interventions  Cryotherapy;Education officer, community;Therapeutic exercise;Gait training;Stair training;Neuromuscular re-education;Patient/family education;Vasopneumatic Device;Taping;Manual techniques;Passive range of motion    PT Next Visit Plan  Nustep, AAROM exercises, hip and quad strengthening, VMS, e-stim/vaso for pain relief and edema control     PT Home Exercise Plan  see patient education    Consulted and Agree with Plan of Care  Patient       Patient will benefit from skilled therapeutic intervention in order to improve the following deficits and impairments:     Visit Diagnosis: Stiffness of right knee, not elsewhere classified  Acute pain of right knee  Muscle weakness (generalized)  Difficulty in walking, not elsewhere classified  Chronic pain of right knee     Problem List There are no active problems to display for this patient.   Amaan Meyer,CHRIS, PTA 12/09/2018, 4:14 PM  [facility] [address] [place], [place], [address] Phone: [phone]   Fax:  [phone]  Name: Diana Sandoval MRN: [id number] Date of Birth: 14-Nov-2001

## 2018-12-09 NOTE — Patient Instructions (Signed)
Living With Anxiety    After being diagnosed with an anxiety disorder, you may be relieved to know why you have felt or behaved a certain way. It is natural to also feel overwhelmed about the treatment ahead and what it will mean for your life. With care and support, you can manage this condition and recover from it.  How to cope with anxiety  Dealing with stress  Stress is your body’s reaction to life changes and events, both good and bad. Stress can last just a few hours or it can be ongoing. Stress can play a major role in anxiety, so it is important to learn both how to cope with stress and how to think about it differently.  Talk with your health care provider or a counselor to learn more about stress reduction. He or she may suggest some stress reduction techniques, such as:  · Music therapy. This can include creating or listening to music that you enjoy and that inspires you.  · Mindfulness-based meditation. This involves being aware of your normal breaths, rather than trying to control your breathing. It can be done while sitting or walking.  · Centering prayer. This is a kind of meditation that involves focusing on a word, phrase, or sacred image that is meaningful to you and that brings you peace.  · Deep breathing. To do this, expand your stomach and inhale slowly through your nose. Hold your breath for 3-5 seconds. Then exhale slowly, allowing your stomach muscles to relax.  · Self-talk. This is a skill where you identify thought patterns that lead to anxiety reactions and correct those thoughts.  · Muscle relaxation. This involves tensing muscles then relaxing them.  Choose a stress reduction technique that fits your lifestyle and personality. Stress reduction techniques take time and practice. Set aside 5-15 minutes a day to do them. Therapists can offer training in these techniques. The training may be covered by some insurance plans. Other things you can do to manage stress include:  · Keeping a  stress diary. This can help you learn what triggers your stress and ways to control your response.  · Thinking about how you respond to certain situations. You may not be able to control everything, but you can control your reaction.  · Making time for activities that help you relax, and not feeling guilty about spending your time in this way.  Therapy combined with coping and stress-reduction skills provides the best chance for successful treatment.  Medicines  Medicines can help ease symptoms. Medicines for anxiety include:  · Anti-anxiety drugs.  · Antidepressants.  · Beta-blockers.  Medicines may be used as the main treatment for anxiety disorder, along with therapy, or if other treatments are not working. Medicines should be prescribed by a health care provider.  Relationships  Relationships can play a big part in helping you recover. Try to spend more time connecting with trusted friends and family members. Consider going to couples counseling, taking family education classes, or going to family therapy. Therapy can help you and others better understand the condition.  How to recognize changes in your condition  Everyone has a different response to treatment for anxiety. Recovery from anxiety happens when symptoms decrease and stop interfering with your daily activities at home or work. This may mean that you will start to:  · Have better concentration and focus.  · Sleep better.  · Be less irritable.  · Have more energy.  · Have improved memory.  It is   important to recognize when your condition is getting worse. Contact your health care provider if your symptoms interfere with home or work and you do not feel like your condition is improving.  Where to find help and support:  You can get help and support from these sources:  · Self-help groups.  · Online and community organizations.  · A trusted spiritual leader.  · Couples counseling.  · Family education classes.  · Family therapy.  Follow these instructions  at home:  · Eat a healthy diet that includes plenty of vegetables, fruits, whole grains, low-fat dairy products, and lean protein. Do not eat a lot of foods that are high in solid fats, added sugars, or salt.  · Exercise. Most adults should do the following:  ? Exercise for at least 150 minutes each week. The exercise should increase your heart rate and make you sweat (moderate-intensity exercise).  ? Strengthening exercises at least twice a week.  · Cut down on caffeine, tobacco, alcohol, and other potentially harmful substances.  · Get the right amount and quality of sleep. Most adults need 7-9 hours of sleep each night.  · Make choices that simplify your life.  · Take over-the-counter and prescription medicines only as told by your health care provider.  · Avoid caffeine, alcohol, and certain over-the-counter cold medicines. These may make you feel worse. Ask your pharmacist which medicines to avoid.  · Keep all follow-up visits as told by your health care provider. This is important.  Questions to ask your health care provider  · Would I benefit from therapy?  · How often should I follow up with a health care provider?  · How long do I need to take medicine?  · Are there any long-term side effects of my medicine?  · Are there any alternatives to taking medicine?  Contact a health care provider if:  · You have a hard time staying focused or finishing daily tasks.  · You spend many hours a day feeling worried about everyday life.  · You become exhausted by worry.  · You start to have headaches, feel tense, or have nausea.  · You urinate more than normal.  · You have diarrhea.  Get help right away if:  · You have a racing heart and shortness of breath.  · You have thoughts of hurting yourself or others.  If you ever feel like you may hurt yourself or others, or have thoughts about taking your own life, get help right away. You can go to your nearest emergency department or call:  · Your local emergency services  (911 in the [place]  · A suicide crisis helpline, such as the National Suicide Prevention Lifeline at 1-800-273-8255. This is open 24-hours a day.  Summary  · Taking steps to deal with stress can help calm you.  · Medicines cannot cure anxiety disorders, but they can help ease symptoms.  · Family, friends, and partners can play a big part in helping you recover from an anxiety disorder.  This information is not intended to replace advice given to you by your health care provider. Make sure you discuss any questions you have with your health care provider.  Document Released: 11/13/2016 Document Revised: 11/13/2016 Document Reviewed: 11/13/2016  Elsevier Interactive Patient Education © 2019 Elsevier Inc.

## 2018-12-11 ENCOUNTER — Ambulatory Visit: Payer: 59 | Admitting: *Deleted

## 2018-12-11 DIAGNOSIS — M25561 Pain in right knee: Secondary | ICD-10-CM

## 2018-12-11 DIAGNOSIS — M25661 Stiffness of right knee, not elsewhere classified: Secondary | ICD-10-CM

## 2018-12-11 DIAGNOSIS — M6281 Muscle weakness (generalized): Secondary | ICD-10-CM

## 2018-12-11 NOTE — Therapy (Signed)
[facility] [place], [place], [address] Phone: [phone]   Fax:  [phone]  Physical Therapy Treatment  Patient Details  Name: Diana Sandoval MRN: [id number] Date of Birth: 2001/04/03 Referring Provider (PT): Eston Mould, MD   Encounter Date: 12/11/2018  PT End of Session - 12/11/18 1410    Visit Number  4    Number of Visits  4    Date for PT Re-Evaluation  12/22/18    PT Start Time  1300    PT Stop Time  1400    PT Time Calculation (min)  60 min       Past Medical History:  Diagnosis Date  . Anxiety     Past Surgical History:  Procedure Laterality Date  . KNEE SURGERY    . medial patellofemoral ligament reconstruction Right 11/14/2018    There were no vitals filed for this visit.  Subjective Assessment - 12/11/18 1357    Subjective  Last day today. 4/10 today RT knee.    Patient is accompained by:  Family member    Pertinent History  right MPFL reconstruction allograft 11/14/18; history of multiple subluxations, panic attacks    Limitations  House hold activities;Walking                       [facility] Adult PT Treatment/Exercise - 12/11/18 0001      Exercises   Exercises  Knee/Hip      Knee/Hip Exercises: Aerobic   Stationary Bike  Level 1 x 8 mins seat 6    Nustep  seat 7 L3 x 12 mins      Knee/Hip Exercises: Supine   Quad Sets  AROM;Right   with VMS x 10 mins   Straight Leg Raises  AAROM;Right      Modalities   Modalities  Vasopneumatic;Electrical Stimulation      Electrical Stimulation   Electrical Stimulation Location  RT VMO  VMS x 10 mins 10 secs on/off with quad sets    Electrical Stimulation Goals  Tone      Vasopneumatic   Number Minutes Vasopneumatic   15 minutes    Vasopnuematic Location   Knee    Vasopneumatic Pressure  Low    Vasopneumatic Temperature   36      Manual Therapy   Manual Therapy  Passive ROM    Manual therapy comments  sitting at edge of  plinth for fleion ROM, Very guarded.    Passive ROM  PROM for flexion in sitting , AAROM SLR and ABDution                   PT Long Term Goals - 12/11/18 1358      PT LONG TERM GOAL #1   Title  Patient will be independent with HEP    Time  2    Period  Weeks    Status  Achieved      PT LONG TERM GOAL #2   Title  Patient will demonstrate ability to perform 3 SLR independently to improve right quadriceps strength.    Time  2    Period  Weeks    Status  Not Met   needs assistance     PT LONG TERM GOAL #3   Title  Patient will demonstrate 90+ degrees of right knee flexion AROM to improve ability to perform functional tasks.    Time  2    Period  Weeks  Status  Partially Met   85 degrees           Plan - 12/11/18 1411    Clinical Impression Statement  Pt arrived today doing fair, but still apprehensive about bending and touching RT knee. She was able to perform nustep and bike fairly well for ROM progression to 85 degrees. She was still unable to perform a SLR by herself and needed assistance.. VMS used again for VMO facilitation with QS. ROM and SLR LTGs NM due to deficits.    Clinical Presentation  Stable    Clinical Decision Making  Low    Rehab Potential  Good    Clinical Impairments Affecting Rehab Potential  fear of movement, panic attacks,    PT Frequency  2x / week    PT Duration  2 weeks    PT Treatment/Interventions  Cryotherapy;Lobbyist;Therapeutic exercise;Gait training;Stair training;Neuromuscular re-education;Patient/family education;Vasopneumatic Device;Taping;Manual techniques;Passive range of motion    PT Next Visit Plan  DC to HEP and Pt to resume PT in [place].    PT Home Exercise Plan  see patient education    Consulted and Agree with Plan of Care  Patient    Family Member Consulted  Mother       Patient will benefit from skilled therapeutic intervention in order to improve the following deficits and  impairments:  Abnormal gait, Pain, Decreased activity tolerance, Decreased endurance, Decreased range of motion, Decreased strength, Decreased balance, Difficulty walking, Increased edema  Visit Diagnosis: Stiffness of right knee, not elsewhere classified  Acute pain of right knee  Muscle weakness (generalized)     Problem List There are no active problems to display for this patient.   ,CHRIS, PTA 12/11/2018, 2:31 PM  [facility] [address] [place], [place], [address] Phone: [phone]   Fax:  [phone]  Name: KAROL SKARZYNSKI MRN: [id number] Date of Birth: 2001-08-26   PHYSICAL THERAPY DISCHARGE SUMMARY  Visits from Start of Care: 4  Current functional level related to goals / functional outcomes: See above   Remaining deficits: Quad strength, see goals   Education / Equipment: HEP Plan: Patient agrees to discharge.  Patient goals were partially met. Patient is being discharged due to the patient's request.  ?????     Patient returning to school in [place], [place]. Gabriela Eves, PT, DPT

## 2018-12-15 ENCOUNTER — Telehealth: Payer: Self-pay | Admitting: *Deleted

## 2018-12-15 DIAGNOSIS — M25661 Stiffness of right knee, not elsewhere classified: Secondary | ICD-10-CM | POA: Diagnosis not present

## 2018-12-15 DIAGNOSIS — S83002D Unspecified subluxation of left patella, subsequent encounter: Secondary | ICD-10-CM | POA: Diagnosis not present

## 2018-12-15 DIAGNOSIS — S83001D Unspecified subluxation of right patella, subsequent encounter: Secondary | ICD-10-CM | POA: Diagnosis not present

## 2018-12-15 MED ORDER — FLUOXETINE HCL 10 MG PO CAPS: 10.0000 mg | ORAL_CAPSULE | Freq: Every day | ORAL | 0 refills | Status: DC

## 2018-12-15 NOTE — Telephone Encounter (Signed)
Patient informed, Rx sent.  

## 2018-12-15 NOTE — Telephone Encounter (Signed)
Okay to try Prozac 10 mg start with half tablet for 1 week to see how she does, dispense 30 and have her call in 2 weeks

## 2018-12-15 NOTE — Telephone Encounter (Signed)
Patient was prescribed Lexapro 10 mg and states it causing panic attacks, has history of panic attacks, but none x 1 year until starting lexapro.woke up out of sleep with attack and anxiety last night, notes has happened several times.  Patient asked if she can switch to another medication?

## 2018-12-16 ENCOUNTER — Telehealth: Payer: Self-pay

## 2018-12-16 NOTE — Telephone Encounter (Signed)
Yesterday you recommended  "Okay to try Prozac 10 mg start with half tablet for 1 week to see how she does, dispense 30 and have her call in 2 weeks."  Patient said when she got home with the Rx we sent capsules and of course, she cannot break them. She asked what you recommend.

## 2018-12-16 NOTE — Telephone Encounter (Signed)
Telephone call, reviewed 10 mg is a low dose, had been on Lexapro which caused anxiety/panic feeling, reviewed those feelings most likely will not occur with Prozac.  Instructed to call if continued problems, follow-up with counseling.

## 2018-12-18 DIAGNOSIS — S83002D Unspecified subluxation of left patella, subsequent encounter: Secondary | ICD-10-CM | POA: Diagnosis not present

## 2018-12-18 DIAGNOSIS — S83001D Unspecified subluxation of right patella, subsequent encounter: Secondary | ICD-10-CM | POA: Diagnosis not present

## 2018-12-18 DIAGNOSIS — M25661 Stiffness of right knee, not elsewhere classified: Secondary | ICD-10-CM | POA: Diagnosis not present

## 2018-12-22 DIAGNOSIS — M25661 Stiffness of right knee, not elsewhere classified: Secondary | ICD-10-CM | POA: Diagnosis not present

## 2018-12-22 DIAGNOSIS — S83001D Unspecified subluxation of right patella, subsequent encounter: Secondary | ICD-10-CM | POA: Diagnosis not present

## 2018-12-22 DIAGNOSIS — S83002D Unspecified subluxation of left patella, subsequent encounter: Secondary | ICD-10-CM | POA: Diagnosis not present

## 2018-12-25 DIAGNOSIS — M25661 Stiffness of right knee, not elsewhere classified: Secondary | ICD-10-CM | POA: Diagnosis not present

## 2018-12-25 DIAGNOSIS — S83001D Unspecified subluxation of right patella, subsequent encounter: Secondary | ICD-10-CM | POA: Diagnosis not present

## 2018-12-25 DIAGNOSIS — S83002D Unspecified subluxation of left patella, subsequent encounter: Secondary | ICD-10-CM | POA: Diagnosis not present

## 2018-12-29 DIAGNOSIS — S83001D Unspecified subluxation of right patella, subsequent encounter: Secondary | ICD-10-CM | POA: Diagnosis not present

## 2018-12-29 DIAGNOSIS — S83002D Unspecified subluxation of left patella, subsequent encounter: Secondary | ICD-10-CM | POA: Diagnosis not present

## 2018-12-29 DIAGNOSIS — M25661 Stiffness of right knee, not elsewhere classified: Secondary | ICD-10-CM | POA: Diagnosis not present

## 2019-01-12 ENCOUNTER — Other Ambulatory Visit: Payer: Self-pay | Admitting: Women's Health

## 2019-01-12 ENCOUNTER — Telehealth: Payer: Self-pay | Admitting: *Deleted

## 2019-01-12 MED ORDER — SERTRALINE HCL 50 MG PO TABS: 50.0000 mg | ORAL_TABLET | Freq: Every day | ORAL | 1 refills | Status: DC

## 2019-01-12 NOTE — Telephone Encounter (Signed)
Patient called to let you know Prozac 10 mg has been working great with anxiety, however she states the medication makes her itching a lot. She would like to stay on Rx, but doesn't want to have itching all the times as well. States she itches as if she has chickenpox. Please advise

## 2019-01-12 NOTE — Telephone Encounter (Signed)
Prozac is capsules not able to cut in half,( she called on 12/16/18 to relay this to you) states you told her to take whole capsule, she does not have a rash, itching is not a sensation, and itching is all over her body. Please advise

## 2019-01-12 NOTE — Telephone Encounter (Signed)
Telephone call, states feels itchy from head to toe, no rash anywhere no hives but just itching.  Instructed to stop the Prozac, states has felt much better with less anxiety while on it would like to try something different.  Stop Prozac for 1 week, Benadryl at bedtime, reviewed importance of no driving after taking Benadryl.  Options reviewed, will try Zoloft, will E scribe Zoloft 50 mg will start with half tablet for 1 to 2 weeks and then full tablet thereafter, continue counseling.,

## 2019-01-12 NOTE — Telephone Encounter (Signed)
Please call and ask if she had any itching when she was on the half tablet of the Prozac?  If none go back to Prozac 5 mg daily and see if that helps with anxiety as well as no itching.  Does she have a rash?  Is the itching just a sensation?  Is the itching sensation only on her trunk?

## 2019-05-25 ENCOUNTER — Ambulatory Visit: Payer: 59 | Attending: Orthopedic Surgery | Admitting: Physical Therapy

## 2019-05-25 ENCOUNTER — Encounter: Payer: Self-pay | Admitting: Physical Therapy

## 2019-05-25 ENCOUNTER — Other Ambulatory Visit: Payer: Self-pay

## 2019-05-25 DIAGNOSIS — M25561 Pain in right knee: Secondary | ICD-10-CM | POA: Insufficient documentation

## 2019-05-25 DIAGNOSIS — M6281 Muscle weakness (generalized): Secondary | ICD-10-CM | POA: Diagnosis present

## 2019-05-25 DIAGNOSIS — G8929 Other chronic pain: Secondary | ICD-10-CM | POA: Diagnosis present

## 2019-05-25 DIAGNOSIS — M25661 Stiffness of right knee, not elsewhere classified: Secondary | ICD-10-CM | POA: Diagnosis present

## 2019-05-25 NOTE — Therapy (Signed)
[facility] [place], [place], [address] Phone: [phone]   Fax:  [phone]  Physical Therapy Evaluation  Patient Details  Name: Diana Sandoval MRN: [id number] Date of Birth: 2001-07-18 Referring Provider (PT): Alferd Patee MD.   Encounter Date: 05/25/2019  PT End of Session - 05/25/19 1149    Visit Number  1    Number of Visits  16    Date for PT Re-Evaluation  07/13/19    Authorization Type  FOTO AT LEAST EVERY 5TH VISIT.  PROGRESS NOTE AT 10TH VISIT.    PT Start Time  1117    PT Stop Time  1158    PT Time Calculation (min)  41 min    Activity Tolerance  Patient tolerated treatment well    Behavior During Therapy  WFL for tasks assessed/performed       Past Medical History:  Diagnosis Date  . Anxiety     Past Surgical History:  Procedure Laterality Date  . KNEE SURGERY    . medial patellofemoral ligament reconstruction Right 11/14/2018    There were no vitals filed for this visit.   Subjective Assessment - 05/25/19 1143    Subjective  COVID-19 screen performed prior to patient entering clinic. The patient underwent a right knee MPFL reconstruction surgery on 11/14/18.  She reports no pain at rest but reports a great deal of pain when going upstairs.  She reports her knee feels unstable as if her kneecap will dislocate.  She is well aware of the need to strengthen her right knee.    Pertinent History  H/o patellar dislocations.    Limitations  Walking    Patient Stated Goals  Strengthen knee so it won't pop out.    Currently in Pain?  No/denies         [facility] PT Assessment - 05/25/19 0001      Assessment   Medical Diagnosis  Right MPFL reconstruction.    Referring Provider (PT)  Alferd Patee MD.    Onset Date/Surgical Date  --   11/14/18 (surgery date).     Precautions   Precautions  --   Pain-free right LE exercises.     Restrictions   Weight Bearing Restrictions  No      Balance Screen   Has the  patient fallen in the past 6 months  No    Has the patient had a decrease in activity level because of a fear of falling?   No    Is the patient reluctant to leave their home because of a fear of falling?   No      Home Environment   Living Environment  Private residence      Prior Function   Level of Independence  Independent      Observation/Other Assessments   Focus on Therapeutic Outcomes (FOTO)   48% limitation.      ROM / Strength   AROM / PROM / Strength  AROM;Strength      AROM   Overall AROM Comments  Full active right knee extension and flexion.      Strength   Overall Strength Comments  Significant decrease in volitional contraction of her right quadriceps.  Right VMO atrophy.  Right SLR= 4-/5 limited by pain which she felt under her kneecap.  Right hip abduction and ER= 4-/5.      Palpation   Palpation comment  No reports of palpable right knee pain.      Ambulation/Gait  Gait Comments  The patient gait is essentially normal though she walks cautiously as she fears her right patella with "pop" out.                Objective measurements completed on examination: See above findings.      [facility] Adult PT Treatment/Exercise - 05/25/19 0001      Exercises   Exercises  Knee/Hip      Knee/Hip Exercises: Supine   Quad Sets Limitations  QS x 15 minutes facilitated with VMS to her right VMO/medial quads with a 10 sec contraction f/b 10 sec rest.                  PT Long Term Goals - 05/25/19 1224      PT LONG TERM GOAL #1   Title  Patient will be independent with HEP    Time  8    Period  Weeks    Status  New      PT LONG TERM GOAL #2   Title  Patient will demonstrate ability to perform 3 SLR independently to improve right quadriceps strength.    Time  8    Period  Weeks    Status  New      PT LONG TERM GOAL #3   Title  5/5 right hip knee strength to increase stability for functional tasks.    Time  8    Period  Weeks    Status  New       PT LONG TERM GOAL #4   Title  Navigate stairs with no pain.    Time  8    Period  Weeks    Status  New             Plan - 05/25/19 1215    Clinical Impression Statement  The patient presents to [facility] s/p right MPFL reconstruction performed on 11/14/18.  She has full right knee range of motion.  She has a significant amount of her right quadriceps atrophy and decrease volitional contraction of this muscle group.  She has increased pain when going upstairs and doing SLR's.  Her gait is essentially normal but cautious as she is afraid her right patella will dislocate. Her functional mobility is impaired.  Her FOTO score demonstrated a 48% limitation.Patient will benefit from skilled physical therapy intervention to address deficits.    Personal Factors and Comorbidities  Comorbidity 1    Comorbidities  H/o patellar dislocations.    Examination-Activity Limitations  Locomotion Level    Stability/Clinical Decision Making  Stable/Uncomplicated    Clinical Decision Making  Low    Rehab Potential  Excellent    PT Frequency  2x / week    PT Duration  8 weeks    PT Treatment/Interventions  ADLs/Self Care Home Management;Electrical Stimulation;Cryotherapy;Moist Heat;Neuromuscular re-education;Therapeutic exercise;Therapeutic activities;Patient/family education;Manual techniques;Vasopneumatic Device    PT Next Visit Plan  VMS to right medial quads; hip and pain-free right quad strengthening exercises, modalites as needed.    Consulted and Agree with Plan of Care  Family member/caregiver       Patient will benefit from skilled therapeutic intervention in order to improve the following deficits and impairments:  Pain, Decreased strength, Decreased activity tolerance  Visit Diagnosis: 1. Chronic pain of right knee   2. Muscle weakness (generalized)        Problem List There are no active problems to display for this patient.   Diana Sandoval, [place] MPT 05/25/2019, 12:31 PM  [facility] [address] [place], [place], [address] Phone: [phone]   Fax:  [phone]  Name: Diana FleetingSydnee G Sandoval MRN: [id number] Date of Birth: Apr 15, 2001

## 2019-05-26 ENCOUNTER — Ambulatory Visit (INDEPENDENT_AMBULATORY_CARE_PROVIDER_SITE_OTHER): Payer: 59 | Admitting: Physician Assistant

## 2019-05-26 DIAGNOSIS — Z0000 Encounter for general adult medical examination without abnormal findings: Secondary | ICD-10-CM

## 2019-05-28 ENCOUNTER — Ambulatory Visit: Payer: 59 | Admitting: Physical Therapy

## 2019-06-01 ENCOUNTER — Other Ambulatory Visit: Payer: Self-pay

## 2019-06-01 ENCOUNTER — Ambulatory Visit: Payer: 59 | Admitting: Physical Therapy

## 2019-06-01 ENCOUNTER — Encounter: Payer: Self-pay | Admitting: Physical Therapy

## 2019-06-01 DIAGNOSIS — M6281 Muscle weakness (generalized): Secondary | ICD-10-CM

## 2019-06-01 DIAGNOSIS — M25561 Pain in right knee: Secondary | ICD-10-CM | POA: Diagnosis not present

## 2019-06-01 DIAGNOSIS — G8929 Other chronic pain: Secondary | ICD-10-CM

## 2019-06-01 DIAGNOSIS — M25661 Stiffness of right knee, not elsewhere classified: Secondary | ICD-10-CM

## 2019-06-01 NOTE — Therapy (Signed)
[facility] [address] [place], [place], [address] Phone: [phone]   Fax:  [phone]  Physical Therapy Treatment  Patient Details  Name: Diana Sandoval MRN: [id number] Date of Birth: 04-18-2001 Referring Provider (PT): Theodosia Quayale Boyd MD.   Encounter Date: 06/01/2019  PT End of Session - 06/01/19 1127    Visit Number  2    Number of Visits  16    Date for PT Re-Evaluation  07/13/19    Authorization Type  FOTO AT LEAST EVERY 5TH VISIT.  PROGRESS NOTE AT 10TH VISIT.    PT Start Time  1119    PT Stop Time  1203    PT Time Calculation (min)  44 min    Activity Tolerance  Patient tolerated treatment well    Behavior During Therapy  WFL for tasks assessed/performed       Past Medical History:  Diagnosis Date  . Anxiety     Past Surgical History:  Procedure Laterality Date  . KNEE SURGERY    . medial patellofemoral ligament reconstruction Right 11/14/2018    There were no vitals filed for this visit.  Subjective Assessment - 06/01/19 1122    Subjective  COVID 19 screening performed on patient upon arrival. Reports that she really wants to focus on strengthening. Reports that she does have some discomfort intermittantly under the patella.    Pertinent History  H/o patellar dislocations.    Limitations  Walking    Patient Stated Goals  Strengthen knee so it won't pop out.    Currently in Pain?  No/denies         [facility] 06/01[facility]19 0001      Assessment   Medical Diagnosis  Right MPFL reconstruction.    Referring Provider (PT)  Theodosia Quayale Boyd MD.    Onset Date/Surgical Date  11/14/18    Next MD Visit  06/2019      Restrictions   Weight Bearing Restrictions  No                   [facility] Adult PT Treatment/Exercise - 06/01/19 0001      Knee/Hip Exercises: Aerobic   Nustep  L5 x10 min      Knee/Hip Exercises: Seated   Long Arc Quad  AROM;Right;20 reps   5 sec hold     Knee/Hip Exercises: Supine   Quad Sets  Right;Other (comment)   with Gu[place]ian for neuro re-ed of quad   Short Arc [employer] Timken CompanyQuad Sets  Other (comment)   Assessed for quad strength   Straight Leg Raises  Other (comment)   Assessed for quad strength     Knee/Hip Exercises: Sidelying   Hip ABduction  AROM;Right;20 reps    Clams  R SL hip clam AROM x20 reps                  PT Long Term Goals - 05/25/19 1224      PT LONG TERM GOAL #1   Title  Patient will be independent with HEP    Time  8    Period  Weeks    Status  New      PT LONG TERM GOAL #2   Title  Patient will demonstrate ability to perform 3 SLR independently to improve right quadriceps strength.    Time  8    Period  Weeks    Status  New      PT LONG TERM GOAL #3   Title  5/5 right hip knee strength to increase stability for functional tasks.    Time  8    Period  Weeks    Status  New      PT LONG TERM GOAL #4   Title  Navigate stairs with no pain.    Time  8    Period  Weeks    Status  New            Plan - 06/01/19 1207    Clinical Impression Statement  Patient presented in clinic with reports of great amount of R quad weakness after stopping PT while in college due to [place]. Patient able to complete therex but limited with progressive supine strengthening with SLR and SAQ. Patient unable to hold motion as well as lacked eccentric control. Patient encouraged greatly to continue QS at home as well as HEP provided by MPT to allow for progression in PT clinic. Patient educated thoroughly regarding LE anatomy and mechanics of the knee throughout treatment. Patient very sensitive to stimulation utilized during russian VMS to R quad/ VMO.    Personal Factors and Comorbidities  Comorbidity 1    Comorbidities  H/o patellar dislocations.    Examination-Activity Limitations  Locomotion Level    Stability/Clinical Decision Making  Stable/Uncomplicated    Rehab Potential  Excellent    PT Frequency  2x / week    PT Duration  8 weeks    PT  Treatment/Interventions  ADLs/Self Care Home Management;Electrical Stimulation;Cryotherapy;Moist Heat;Neuromuscular re-education;Therapeutic exercise;Therapeutic activities;Patient/family education;Manual techniques;Vasopneumatic Device    PT Next Visit Plan  VMS to right medial quads; hip and pain-free right quad strengthening exercises, modalites as needed.    Consulted and Agree with Plan of Care  Patient       Patient will benefit from skilled therapeutic intervention in order to improve the following deficits and impairments:  Pain, Decreased strength, Decreased activity tolerance  Visit Diagnosis: 1. Chronic pain of right knee   2. Muscle weakness (generalized)   3. Stiffness of right knee, not elsewhere classified        Problem List There are no active problems to display for this patient.   Standley Brooking, PTA 06/01/2019, 12:15 PM  [facility] [address] [place], [place], [address] Phone: [phone]   Fax:  [phone]  Name: Diana Sandoval MRN: [id number] Date of Birth: [birth year]

## 2019-06-08 ENCOUNTER — Ambulatory Visit: Payer: 59 | Attending: Orthopedic Surgery | Admitting: Physical Therapy

## 2019-06-08 ENCOUNTER — Encounter: Payer: Self-pay | Admitting: Physical Therapy

## 2019-06-08 ENCOUNTER — Other Ambulatory Visit: Payer: Self-pay

## 2019-06-08 DIAGNOSIS — M25661 Stiffness of right knee, not elsewhere classified: Secondary | ICD-10-CM | POA: Diagnosis present

## 2019-06-08 DIAGNOSIS — M6281 Muscle weakness (generalized): Secondary | ICD-10-CM | POA: Diagnosis present

## 2019-06-08 DIAGNOSIS — G8929 Other chronic pain: Secondary | ICD-10-CM | POA: Diagnosis present

## 2019-06-08 DIAGNOSIS — M25561 Pain in right knee: Secondary | ICD-10-CM | POA: Insufficient documentation

## 2019-06-08 NOTE — Therapy (Signed)
[facility] [address] [place], [place], [address] Phone: [phone]   Fax:  [phone]  Physical Therapy Treatment  Patient Details  Name: Diana Sandoval MRN: [id number] Date of Birth: January 18, 2001 Referring Provider (PT): Theodosia Quayale Boyd MD.   Encounter Date: 06/08/2019  PT End of Session - 06/08/19 1122    Visit Number  3    Number of Visits  16    Date for PT Re-Evaluation  07/13/19    Authorization Type  FOTO AT LEAST EVERY 5TH VISIT.  PROGRESS NOTE AT 10TH VISIT.    PT Start Time  1116    PT Stop Time  1200    PT Time Calculation (min)  44 min    Activity Tolerance  Patient tolerated treatment well    Behavior During Therapy  WFL for tasks assessed/performed       Past Medical History:  Diagnosis Date  . Anxiety     Past Surgical History:  Procedure Laterality Date  . KNEE SURGERY    . medial patellofemoral ligament reconstruction Right 11/14/2018    There were no vitals filed for this visit.  Subjective Assessment - 06/08/19 1121    Subjective  COVID 19 screening performed on patient upon arrival. Patient reports that she worked her first shift at [employer] yesterday and was good for roughly 4 hours but began to hurt and become uncomfortable. Patient reports swelling in R knee after her shift as well.    Pertinent History  H/o patellar dislocations.    Limitations  Walking    Patient Stated Goals  Strengthen knee so it won't pop out.    Currently in Pain?  No/denies         [facility] - 06/08/19 0001      Assessment   Medical Diagnosis  Right MPFL reconstruction.    Referring Provider (PT)  Theodosia Quayale Boyd MD.    Onset Date/Surgical Date  11/14/18    Next MD Visit  06/2019      Restrictions   Weight Bearing Restrictions  No                   [facility] Adult PT Treatment/Exercise - 06/08/19 0001      Knee/Hip Exercises: Aerobic   Nustep  L5 x10 min   Only LEs     Knee/Hip Exercises: Standing    Heel Raises Limitations  Church pews/ toe raise x20 reps    Forward Lunges  Left;15 reps;2 seconds    Functional Squat  10 reps   reported grinding sensation posterior to patella     Knee/Hip Exercises: Supine   Quad Sets  Right;Other (comment)   with Guern[place]ian stimulation for neuro re-ed   Bridges with Harley-DavidsonBall Squeeze  Strengthening;20 reps      Knee/Hip Exercises: Sidelying   Hip ABduction  AROM;Right;20 reps    Clams  R SL hip clam red theraband x20 reps      Modalities   Modalities  Geologist, engineeringlectrical Stimulation      Electrical Stimulation   Electrical Stimulation Location  R VMO/Quad    Engineer, manufacturinglectrical Stimulation Action  Russian    Electrical Stimulation Parameters  10/10, 70 pps, 5 sec ramp, 50% duty cycle x15 min    Electrical Stimulation Goals  Neuromuscular facilitation                  PT Long Term Goals - 06/08/19 1149      PT LONG TERM GOAL #1  Title  Patient will be independent with HEP    Time  8    Period  Weeks    Status  Achieved      PT LONG TERM GOAL #2   Title  Patient will demonstrate ability to perform 3 SLR independently to improve right quadriceps strength.    Time  8    Period  Weeks    Status  On-going      PT LONG TERM GOAL #3   Title  5/5 right hip knee strength to increase stability for functional tasks.    Time  8    Period  Weeks    Status  On-going      PT LONG TERM GOAL #4   Title  Navigate stairs with no pain.    Time  8    Period  Weeks    Status  On-going   "3/10" without carrying, "8/10" with carrying anything upstairs 06/08/2019           Plan - 06/08/19 1150    Clinical Impression Statement  Patient presented in clinic with reports of discomfort and fatigue following statically standing for approximately 5 hours at work. Low level discomfort reported with the prolonged standing after approximately 4 hours in which states an OTC pain medication may have alleviated the discomfort. Patient guided through more various  strengthening exercises to progress quad strengthening. Patient limited by a grinding sensation posterior to R patella with lunging and especially with mini squat. Patient compliant with HEP per reports. Neuro re-ed of R quad and VMO continued with russian stimulation with patient reporting sensitivity to stimulation but not as hypersensitive as in previous sessions.    Personal Factors and Comorbidities  Comorbidity 1    Comorbidities  H/o patellar dislocations.    Examination-Activity Limitations  Locomotion Level    Stability/Clinical Decision Making  Stable/Uncomplicated    Rehab Potential  Excellent    PT Frequency  2x / week    PT Duration  8 weeks    PT Treatment/Interventions  ADLs/Self Care Home Management;Electrical Stimulation;Cryotherapy;Moist Heat;Neuromuscular re-education;Therapeutic exercise;Therapeutic activities;Patient/family education;Manual techniques;Vasopneumatic Device    PT Next Visit Plan  VMS to right medial quads; hip and pain-free right quad strengthening exercises, modalites as needed.    Consulted and Agree with Plan of Care  Patient       Patient will benefit from skilled therapeutic intervention in order to improve the following deficits and impairments:  Pain, Decreased strength, Decreased activity tolerance  Visit Diagnosis: 1. Chronic pain of right knee   2. Muscle weakness (generalized)   3. Stiffness of right knee, not elsewhere classified        Problem List There are no active problems to display for this patient.   Standley Brooking 06/08/2019, 12:04 PM  [facility] [place], [place], [address] Phone: [phone]   Fax:  [phone]  Name: Diana Sandoval MRN: [id number] Date of Birth: 02-20-2001

## 2019-06-10 ENCOUNTER — Encounter: Payer: Self-pay | Admitting: Physical Therapy

## 2019-06-10 ENCOUNTER — Other Ambulatory Visit: Payer: Self-pay

## 2019-06-10 ENCOUNTER — Ambulatory Visit: Payer: 59 | Admitting: Physical Therapy

## 2019-06-10 DIAGNOSIS — G8929 Other chronic pain: Secondary | ICD-10-CM

## 2019-06-10 DIAGNOSIS — M25561 Pain in right knee: Secondary | ICD-10-CM | POA: Diagnosis not present

## 2019-06-10 DIAGNOSIS — M6281 Muscle weakness (generalized): Secondary | ICD-10-CM

## 2019-06-10 NOTE — Therapy (Signed)
[facility] [address] [place], [place], [address] Phone: [phone]   Fax:  [phone]  Physical Therapy Treatment  Patient Details  Name: Diana FleetingSydnee G Sheek MRN: [id number] Date of Birth: 10-31-2001 Referring Provider (PT): Theodosia Quayale Boyd MD.   Encounter Date: 06/10/2019  PT End of Session - 06/10/19 1121    Visit Number  4    Number of Visits  16    Date for PT Re-Evaluation  07/13/19    Authorization Type  FOTO AT LEAST EVERY 5TH VISIT.  PROGRESS NOTE AT 10TH VISIT.    PT Start Time  1118    PT Stop Time  1200    PT Time Calculation (min)  42 min    Activity Tolerance  Patient tolerated treatment well    Behavior During Therapy  WFL for tasks assessed/performed       Past Medical History:  Diagnosis Date  . Anxiety     Past Surgical History:  Procedure Laterality Date  . KNEE SURGERY    . medial patellofemoral ligament reconstruction Right 11/14/2018    There were no vitals filed for this visit.  Subjective Assessment - 06/10/19 1120    Subjective  COVID 19 screening performed on patient upon arrival. Patient had no complaints upon arrival.    Pertinent History  H/o patellar dislocations.    Limitations  Walking    Patient Stated Goals  Strengthen knee so it won't pop out.    Currently in Pain?  Yes    Pain Score  2     Pain Location  Knee    Pain Orientation  Right    Pain Descriptors / Indicators  Discomfort    Pain Type  Chronic pain    Pain Onset  More than a month ago    Pain Frequency  Intermittent         [facility] PT Assessment - 06/10/19 0001      Assessment   Medical Diagnosis  Right MPFL reconstruction.    Referring Provider (PT)  Theodosia Quayale Boyd MD.    Onset Date/Surgical Date  11/14/18    Next MD Visit  06/2019      Restrictions   Weight Bearing Restrictions  No                   [facility] Adult PT Treatment/Exercise - 06/10/19 0001      Knee/Hip Exercises: Aerobic   Recumbent Bike  L4 x10 min       Knee/Hip Exercises: Standing   Heel Raises Limitations  Church pews/ toe raise x20 reps      Knee/Hip Exercises: Supine   Quad Sets  Strengthening;Right;20 reps   5 sec hold   Short Arc Quad Sets  Right   with VMS co-contract for neuro re-ed   Bridges with Harley-DavidsonBall Squeeze  Strengthening;20 reps      Knee/Hip Exercises: Sidelying   Hip ABduction  AROM;Right;20 reps    Clams  R SL hip clam red theraband x20 reps      Modalities   Modalities  Geologist, engineeringlectrical Stimulation      Electrical Stimulation   Electrical Stimulation Location  R VMO/Quad    Electrical Stimulation Action  VMS co-contract    Electrical Stimulation Parameters  10/10, 100 pps, 2 sec ramp, 260 usec x15 min    Electrical Stimulation Goals  Neuromuscular facilitation                  PT Long Term Goals -  06/08/19 1149      PT LONG TERM GOAL #1   Title  Patient will be independent with HEP    Time  8    Period  Weeks    Status  Achieved      PT LONG TERM GOAL #2   Title  Patient will demonstrate ability to perform 3 SLR independently to improve right quadriceps strength.    Time  8    Period  Weeks    Status  On-going      PT LONG TERM GOAL #3   Title  5/5 right hip knee strength to increase stability for functional tasks.    Time  8    Period  Weeks    Status  On-going      PT LONG TERM GOAL #4   Title  Navigate stairs with no pain.    Time  8    Period  Weeks    Status  On-going   "3/10" without carrying, "8/10" with carrying anything upstairs 06/08/2019           Plan - 06/10/19 1147    Clinical Impression Statement  Patient presented in clinic with minimal complaints of pain in R knee. Patient guided through hip/knee strengthening with minimal voluntary contraction from R quad. VMS co-contraction completed to R quad with SAQ to further emphasize neuro re-ed of quad. Normal stimulation response noted following removal of the modality.    Personal Factors and Comorbidities  Comorbidity  1    Comorbidities  H/o patellar dislocations.    Examination-Activity Limitations  Locomotion Level    Stability/Clinical Decision Making  Stable/Uncomplicated    Rehab Potential  Excellent    PT Frequency  2x / week    PT Duration  8 weeks    PT Treatment/Interventions  ADLs/Self Care Home Management;Electrical Stimulation;Cryotherapy;Moist Heat;Neuromuscular re-education;Therapeutic exercise;Therapeutic activities;Patient/family education;Manual techniques;Vasopneumatic Device    PT Next Visit Plan  VMS to right medial quads; hip and pain-free right quad strengthening exercises, modalites as needed.    Consulted and Agree with Plan of Care  Patient       Patient will benefit from skilled therapeutic intervention in order to improve the following deficits and impairments:  Pain, Decreased strength, Decreased activity tolerance  Visit Diagnosis: 1. Chronic pain of right knee   2. Muscle weakness (generalized)        Problem List There are no active problems to display for this patient.   Standley Brooking, PTA 06/10/2019, 12:01 PM  [facility] [address] [place], [place], [address] Phone: [phone]   Fax:  [phone]  Name: Diana Sandoval MRN: [id number] Date of Birth: Jan 03, 2001

## 2019-06-11 ENCOUNTER — Other Ambulatory Visit: Payer: Self-pay

## 2019-06-12 ENCOUNTER — Ambulatory Visit (INDEPENDENT_AMBULATORY_CARE_PROVIDER_SITE_OTHER): Payer: 59 | Admitting: Family Medicine

## 2019-06-12 ENCOUNTER — Ambulatory Visit: Payer: 59 | Admitting: Family Medicine

## 2019-06-12 ENCOUNTER — Encounter: Payer: Self-pay | Admitting: Family Medicine

## 2019-06-12 VITALS — BP 114/77 | HR 73 | Temp 98.1°F | Ht 67.0 in | Wt 132.0 lb

## 2019-06-12 DIAGNOSIS — Z7689 Persons encountering health services in other specified circumstances: Secondary | ICD-10-CM

## 2019-06-12 DIAGNOSIS — F329 Major depressive disorder, single episode, unspecified: Secondary | ICD-10-CM | POA: Insufficient documentation

## 2019-06-12 DIAGNOSIS — F411 Generalized anxiety disorder: Secondary | ICD-10-CM

## 2019-06-12 DIAGNOSIS — F32A Depression, unspecified: Secondary | ICD-10-CM | POA: Insufficient documentation

## 2019-06-12 DIAGNOSIS — Z0000 Encounter for general adult medical examination without abnormal findings: Secondary | ICD-10-CM | POA: Diagnosis not present

## 2019-06-12 MED ORDER — CITALOPRAM HYDROBROMIDE 20 MG PO TABS: 20.0000 mg | ORAL_TABLET | Freq: Every day | ORAL | 1 refills | Status: DC

## 2019-06-12 NOTE — Patient Instructions (Signed)
Generalized Anxiety Disorder, Adult Generalized anxiety disorder (GAD) is a mental health disorder. People with this condition constantly worry about everyday events. Unlike normal anxiety, worry related to GAD is not triggered by a specific event. These worries also do not fade or get better with time. GAD interferes with life functions, including relationships, work, and school. GAD can vary from mild to severe. People with severe GAD can have intense waves of anxiety with physical symptoms (panic attacks). What are the causes? The exact cause of GAD is not known. What increases the risk? This condition is more likely to develop in:  Women.  People who have a family history of anxiety disorders.  People who are very shy.  People who experience very stressful life events, such as the death of a loved one.  People who have a very stressful family environment. What are the signs or symptoms? People with GAD often worry excessively about many things in their lives, such as their health and family. They may also be overly concerned about:  Doing well at work.  Being on time.  Natural disasters.  Friendships. Physical symptoms of GAD include:  Fatigue.  Muscle tension or having muscle twitches.  Trembling or feeling shaky.  Being easily startled.  Feeling like your heart is pounding or racing.  Feeling out of breath or like you cannot take a deep breath.  Having trouble falling asleep or staying asleep.  Sweating.  Nausea, diarrhea, or irritable bowel syndrome (IBS).  Headaches.  Trouble concentrating or remembering facts.  Restlessness.  Irritability. How is this diagnosed? Your health care provider can diagnose GAD based on your symptoms and medical history. You will also have a physical exam. The health care provider will ask specific questions about your symptoms, including how severe they are, when they started, and if they come and go. Your health care  provider may ask you about your use of alcohol or drugs, including prescription medicines. Your health care provider may refer you to a mental health specialist for further evaluation. Your health care provider will do a thorough examination and may perform additional tests to rule out other possible causes of your symptoms. To be diagnosed with GAD, a person must have anxiety that:  Is out of his or her control.  Affects several different aspects of his or her life, such as work and relationships.  Causes distress that makes him or her unable to take part in normal activities.  Includes at least three physical symptoms of GAD, such as restlessness, fatigue, trouble concentrating, irritability, muscle tension, or sleep problems. Before your health care provider can confirm a diagnosis of GAD, these symptoms must be present more days than they are not, and they must last for six months or longer. How is this treated? The following therapies are usually used to treat GAD:  Medicine. Antidepressant medicine is usually prescribed for long-term daily control. Antianxiety medicines may be added in severe cases, especially when panic attacks occur.  Talk therapy (psychotherapy). Certain types of talk therapy can be helpful in treating GAD by providing support, education, and guidance. Options include: ? Cognitive behavioral therapy (CBT). People learn coping skills and techniques to ease their anxiety. They learn to identify unrealistic or negative thoughts and behaviors and to replace them with positive ones. ? Acceptance and commitment therapy (ACT). This treatment teaches people how to be mindful as a way to cope with unwanted thoughts and feelings. ? Biofeedback. This process trains you to manage your body's response (  physiological response) through breathing techniques and relaxation methods. You will work with a therapist while machines are used to monitor your physical symptoms.  Stress  management techniques. These include yoga, meditation, and exercise. A mental health specialist can help determine which treatment is best for you. Some people see improvement with one type of therapy. However, other people require a combination of therapies. Follow these instructions at home:  Take over-the-counter and prescription medicines only as told by your health care provider.  Try to maintain a normal routine.  Try to anticipate stressful situations and allow extra time to manage them.  Practice any stress management or self-calming techniques as taught by your health care provider.  Do not punish yourself for setbacks or for not making progress.  Try to recognize your accomplishments, even if they are small.  Keep all follow-up visits as told by your health care provider. This is important. Contact a health care provider if:  Your symptoms do not get better.  Your symptoms get worse.  You have signs of depression, such as: ? A persistently sad, cranky, or irritable mood. ? Loss of enjoyment in activities that used to bring you joy. ? Change in weight or eating. ? Changes in sleeping habits. ? Avoiding friends or family members. ? Loss of energy for normal tasks. ? Feelings of guilt or worthlessness. Get help right away if:  You have serious thoughts about hurting yourself or others. If you ever feel like you may hurt yourself or others, or have thoughts about taking your own life, get help right away. You can go to your nearest emergency department or call:  Your local emergency services (911 in the [place]  A suicide crisis helpline, such as the [facility] at 434 211 5661. This is open 24 hours a day. Summary  Generalized anxiety disorder (GAD) is a mental health disorder that involves worry that is not triggered by a specific event.  People with GAD often worry excessively about many things in their lives, such as their health and  family.  GAD may cause physical symptoms such as restlessness, trouble concentrating, sleep problems, frequent sweating, nausea, diarrhea, headaches, and trembling or muscle twitching.  A mental health specialist can help determine which treatment is best for you. Some people see improvement with one type of therapy. However, other people require a combination of therapies. This information is not intended to replace advice given to you by your health care provider. Make sure you discuss any questions you have with your health care provider. Document Released: 03/16/2013 Document Revised: 11/01/2017 Document Reviewed: 10/09/2016 Elsevier Patient Education  2020 [place] 11-59 Years Old, Female Preventive care refers to lifestyle choices and visits with your health care provider that can promote health and wellness. At this stage in your life, you may start seeing a primary care physician instead of a pediatrician. Your health care is now your responsibility. Preventive care for young adults includes:  A yearly physical exam. This is also called an annual wellness visit.  Regular dental and eye exams.  Immunizations.  Screening for certain conditions.  Healthy lifestyle choices, such as diet and exercise. What can I expect for my preventive care visit? Physical exam Your health care provider may check:  Height and weight. These may be used to calculate body mass index (BMI), which is a measurement that tells if you are at a healthy weight.  Heart rate and blood pressure.  Body temperature. Counseling Your health  care provider may ask you questions about:  Past medical problems and family medical history.  Alcohol, tobacco, and drug use.  Home and relationship well-being.  Access to firearms.  Emotional well-being.  Diet, exercise, and sleep habits.  Sexual activity and sexual health.  Method of birth control.  Menstrual cycle.  Pregnancy  history. What immunizations do I need?  Influenza (flu) vaccine  This is recommended every year. Tetanus, diphtheria, and pertussis (Tdap) vaccine  You may need a Td booster every 10 years. Varicella (chickenpox) vaccine  You may need this vaccine if you have not already been vaccinated. Human papillomavirus (HPV) vaccine  If recommended by your health care provider, you may need three doses over 6 months. Measles, mumps, and rubella (MMR) vaccine  You may need at least one dose of MMR. You may also need a second dose. Meningococcal conjugate (MenACWY) vaccine  One dose is recommended if you are 78-60 years old and a Market researcher living in a residence hall, or if you have one of several medical conditions. You may also need additional booster doses. Pneumococcal conjugate (PCV13) vaccine  You may need this if you have certain conditions and were not previously vaccinated. Pneumococcal polysaccharide (PPSV23) vaccine  You may need one or two doses if you smoke cigarettes or if you have certain conditions. Hepatitis A vaccine  You may need this if you have certain conditions or if you travel or work in places where you may be exposed to hepatitis A. Hepatitis B vaccine  You may need this if you have certain conditions or if you travel or work in places where you may be exposed to hepatitis B. Haemophilus influenzae type b (Hib) vaccine  You may need this if you have certain risk factors. You may receive vaccines as individual doses or as more than one vaccine together in one shot (combination vaccines). Talk with your health care provider about the risks and benefits of combination vaccines. What tests do I need? Blood tests  Lipid and cholesterol levels. These may be checked every 5 years starting at age 57.  Hepatitis C test.  Hepatitis B test. Screening  Pelvic exam and Pap test. This may be done every 3 years starting at age 67.  Sexually transmitted  disease (STD) testing, if you are at risk.  BRCA-related cancer screening. This may be done if you have a family history of breast, ovarian, tubal, or peritoneal cancers. Other tests  Tuberculosis skin test.  Vision and hearing tests.  Skin exam.  Breast exam. Follow these instructions at home: Eating and drinking   Eat a diet that includes fresh fruits and vegetables, whole grains, lean protein, and low-fat dairy products.  Drink enough fluid to keep your urine pale yellow.  Do not drink alcohol if: ? Your health care provider tells you not to drink. ? You are pregnant, may be pregnant, or are planning to become pregnant. ? You are under the legal drinking age. In the [place] the legal drinking age is 80.  If you drink alcohol: ? Limit how much you have to 0-1 drink a day. ? Be aware of how much alcohol is in your drink. In the [place] one drink equals one 12 oz bottle of beer (355 mL), one 5 oz glass of wine (148 mL), or one 1 oz glass of hard liquor (44 mL). Lifestyle  Take daily care of your teeth and gums.  Stay active. Exercise at least 30 minutes 5 or more days  of the week.  Do not use any products that contain nicotine or tobacco, such as cigarettes, e-cigarettes, and chewing tobacco. If you need help quitting, ask your health care provider.  Do not use drugs.  If you are sexually active, practice safe sex. Use a condom or other form of birth control (contraception) in order to prevent pregnancy and STIs (sexually transmitted infections). If you plan to become pregnant, see your health care provider for a pre-conception visit.  Find healthy ways to cope with stress, such as: ? Meditation, yoga, or listening to music. ? Journaling. ? Talking to a trusted person. ? Spending time with friends and family. Safety  Always wear your seat belt while driving or riding in a vehicle.  Do not drive if you have been drinking alcohol. Do not ride with someone who has been  drinking.  Do not drive when you are tired or distracted. Do not text while driving.  Wear a helmet and other protective equipment during sports activities.  If you have firearms in your house, make sure you follow all gun safety procedures.  Seek help if you have been bullied, physically abused, or sexually abused.  Use the Internet responsibly to avoid dangers such as online bullying and online sex predators. What's next?  Go to your health care provider once a year for a well check visit.  Ask your health care provider how often you should have your eyes and teeth checked.  Stay up to date on all vaccines. This information is not intended to replace advice given to you by your health care provider. Make sure you discuss any questions you have with your health care provider. Document Released: 04/05/2016 Document Revised: 11/13/2018 Document Reviewed: 11/13/2018 Elsevier Patient Education  2020 [employer].

## 2019-06-12 NOTE — Progress Notes (Signed)
Subjective:  Patient ID: Diana Sandoval, female    DOB: [birth year]  Age: 18 y.o. MRN: [id number]  Patient Care Team: Gwenlyn FudgeJoyce, Donalee Gaumond F, FNP as PCP - General (Family Medicine)   CC:  Chief Complaint  Patient presents with  . New Patient (Initial Visit)  . Establish Care  . Anxiety    Patient states that she has had it for awhile and states that the prozac and zoloft had side effects. Would like to be started on something else.  . Depression    HPI Diana Sandoval presents to establish care and discuss anxiety/depression.   Occupation: [employer]. 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 Marital status: Single, Substance use: Denies Diet: little sweets and red meat, Exercise: walks daily Last eye exam: 1-2 years ago Last dental exam: 2-3 years ago Tdap Vaccine: attempting to get records from [facility].    Anxiety: Previously tried Prozac, Lexparo, and Zoloft for anxiety. Lexapro and Zoloft caused a significant increase in panic attacks. Prozac caused itching but worked well with her depression and anxiety.    Depression screen PHQ 2/9 06/12/2019  Decreased Interest 2  Down, Depressed, Hopeless 3  PHQ - 2 Score 5  Altered sleeping 3  Tired, decreased energy 2  Change in appetite 2  Feeling bad or failure about yourself  3  Trouble concentrating 1  Moving slowly or fidgety/restless 0  Suicidal thoughts 0  PHQ-9 Score 16  Difficult doing work/chores Somewhat difficult   GAD 7 : Generalized Anxiety Score 06/12/2019  Nervous, Anxious, on Edge 3  Control/stop worrying 3  Worry too much - different things 3  Trouble relaxing 3  Restless 3  Easily annoyed or irritable 2  Afraid - awful might happen 2  Total GAD 7 Score 19  Anxiety Difficulty Somewhat difficult   Review of Systems  Constitutional: Negative for chills, fever, malaise/fatigue and weight loss.  HENT: Negative for congestion, ear discharge, ear pain,  nosebleeds, sinus pain, sore throat and tinnitus.   Eyes: Negative for blurred vision, double vision, pain, discharge and redness.  Respiratory: Negative for cough, shortness of breath and wheezing.   Cardiovascular: Negative for chest pain, palpitations and leg swelling.  Gastrointestinal: Negative for abdominal pain, constipation, diarrhea, heartburn, nausea and vomiting.  Genitourinary: Negative for dysuria, frequency and urgency.  Musculoskeletal: Negative for myalgias.  Skin: Negative for rash.  Neurological: Negative for dizziness, seizures, weakness and headaches.  Psychiatric/Behavioral: Positive for depression. Negative for substance abuse and suicidal ideas. The patient is nervous/anxious.     Past Medical History:  Diagnosis Date  . Anxiety   . Depression     Past Surgical History:  Procedure Laterality Date  . medial patellofemoral ligament reconstruction Right 11/14/2018    Family History  Problem Relation Age of Onset  . Hypertension Mother   . Anxiety disorder Father   . Anxiety disorder Brother   . Transient ischemic attack Maternal Grandfather     Social History   Socioeconomic History  . Marital status: Single    Spouse name: Not on file  . Number of children: Not on file  . Years of education: Not on file  . Highest education level: Not on file  Occupational History  . Not on file  Social Needs  . Financial resource strain: Not on file  . Food insecurity    Worry: Not on file    Inability: Not on file  .  Transportation needs    Medical: Not on file    Non-medical: Not on file  Tobacco Use  . Smoking status: Never Smoker  . Smokeless tobacco: Never Used  Substance and Sexual Activity  . Alcohol use: Never    Frequency: Never  . Drug use: Never  . Sexual activity: Not Currently    Birth control/protection: Condom    Comment: 1st intercourse 18 yo-1 partner  Lifestyle  . Physical activity    Days per week: Not on file    Minutes per  session: Not on file  . Stress: Not on file  Relationships  . Social Musicianconnections    Talks on phone: Not on file    Gets together: Not on file    Attends religious service: Not on file    Active member of club or organization: Not on file    Attends meetings of clubs or organizations: Not on file    Relationship status: Not on file  . Intimate partner violence    Fear of current or ex partner: Not on file    Emotionally abused: Not on file    Physically abused: Not on file    Forced sexual activity: Not on file  Other Topics Concern  . Not on file  Social History Narrative  . Not on file    Current Outpatient Medications:  .  drospirenone-ethinyl estradiol (YAZ) 3-0.02 MG tablet, Take 1 tablet by mouth daily., Disp: 3 Package, Rfl: 4 .  Melatonin 3 MG TABS, Take 1 tablet by mouth at bedtime., Disp: , Rfl:  .  citalopram (CELEXA) 20 MG tablet, Take 1 tablet (20 mg total) by mouth daily., Disp: 30 tablet, Rfl: 1   No Known Allergies  Objective:    BP 114/77   Pulse 73   Temp 98.1 F (36.7 C) (Oral)   Ht 5\' 7"  (1.702 m)   Wt 132 lb (59.9 kg)   LMP 05/13/2019 (Approximate)   BMI 20.67 kg/m    Wt Readings from Last 3 Encounters:  06/12/19 132 lb (59.9 kg)  11/28/18 142 lb (64.4 kg) (72 %, Z= 0.58)*  11/21/18 143 lb (64.9 kg) (73 %, Z= 0.62)*   * Growth percentiles are based on CDC (Girls, 2-20 Years) data.    Physical Exam  Constitutional: She is oriented to person, place, and time. She appears well-developed and well-nourished. No distress.  HENT:  Head: Normocephalic and atraumatic.  Right Ear: External ear normal.  Left Ear: External ear normal.  Nose: Nose normal.  Mouth/Throat: Oropharynx is clear and moist. No oropharyngeal exudate.  Eyes: Pupils are equal, round, and reactive to light. Conjunctivae and EOM are normal. Right eye exhibits no discharge. Left eye exhibits no discharge. No scleral icterus.  Neck: Normal range of motion. Neck supple. No tracheal  deviation present. No thyromegaly present.  Cardiovascular: Normal rate and regular rhythm. Exam reveals no gallop and no friction rub.  No murmur heard. Pulmonary/Chest: Effort normal and breath sounds normal. No stridor. No respiratory distress. She has no wheezes. She has no rales.  Abdominal: Soft. Bowel sounds are normal. She exhibits no distension and no mass. There is no abdominal tenderness. There is no rebound and no guarding.  Musculoskeletal: Normal range of motion.        General: No edema.  Lymphadenopathy:    She has no cervical adenopathy.  Neurological: She is alert and oriented to person, place, and time.  Skin: Skin is warm and dry. She is not diaphoretic.  Psychiatric: She has a normal mood and affect. Her behavior is normal. Judgment and thought content normal.  Vitals reviewed.   Lab Results  Component Value Date   TSH 1.474 10/27/2016   Lab Results  Component Value Date   HGB 12.2 10/27/2016   HCT 36.0 10/27/2016   Lab Results  Component Value Date   NA 140 10/27/2016   K 3.9 10/27/2016   GLUCOSE 97 10/27/2016   BUN 9 10/27/2016   CREATININE 0.80 10/27/2016      Assessment & Plan:   1. Well adult exam - Encouraged routine eye and dental exams. Education provided on preventive care. Patient is not sexually active and therefore declined gonorrhea and chlamydia screening. She also declines HIV screening as she is not high risk.   2. GAD (generalized anxiety disorder) - Education provided on anxiety.  - citalopram (CELEXA) 20 MG tablet; Take 1 tablet (20 mg total) by mouth daily.  Dispense: 30 tablet; Refill: 1  3. Encounter to establish care - Last PCP was a couple of years ago with her pediatrician. Her GYN provider has been prescribing for anxiety/depression.    Follow-up: Return in about 6 weeks (around 07/24/2019) for f/u anxiety . (Telephone visit as she will be back at school).  Hendricks Limes, MSN, APRN, FNP-C [place]

## 2019-06-15 ENCOUNTER — Ambulatory Visit: Payer: 59 | Admitting: Physical Therapy

## 2019-06-15 ENCOUNTER — Encounter: Payer: Self-pay | Admitting: Physical Therapy

## 2019-06-15 ENCOUNTER — Other Ambulatory Visit: Payer: Self-pay

## 2019-06-15 DIAGNOSIS — M25561 Pain in right knee: Secondary | ICD-10-CM

## 2019-06-15 DIAGNOSIS — G8929 Other chronic pain: Secondary | ICD-10-CM

## 2019-06-15 DIAGNOSIS — M6281 Muscle weakness (generalized): Secondary | ICD-10-CM

## 2019-06-15 NOTE — Therapy (Signed)
[facility] [address] [place], [place], [address] Phone: [phone]   Fax:  [phone]  Physical Therapy Treatment  Patient Details  Name: Diana Sandoval MRN: [id number] Date of Birth: 07-26-2001 Referring Provider (PT): Theodosia Quayale Boyd MD.   Encounter Date: 06/15/2019  PT End of Session - 06/15/19 0947    Visit Number  5    Number of Visits  16    Date for PT Re-Evaluation  07/13/19    Authorization Type  FOTO AT LEAST EVERY 5TH VISIT.  PROGRESS NOTE AT 10TH VISIT.    PT Start Time  0900    PT Stop Time  0946    PT Time Calculation (min)  46 min    Activity Tolerance  Patient tolerated treatment well    Behavior During Therapy  WFL for tasks assessed/performed       Past Medical History:  Diagnosis Date  . Anxiety   . Depression     Past Surgical History:  Procedure Laterality Date  . medial patellofemoral ligament reconstruction Right 11/14/2018    There were no vitals filed for this visit.  Subjective Assessment - 06/15/19 0902    Subjective  COVID 19 screening performed on patient upon arrival. Patient reports having her first 8 hour shift yesterday and her pain level was a 6/10. Today she is at a 3/10.    Pertinent History  H/o patellar dislocations.    Limitations  Walking    Patient Stated Goals  Strengthen knee so it won't pop out.    Currently in Pain?  Yes    Pain Location  Knee    Pain Orientation  Right    Pain Descriptors / Indicators  Discomfort    Pain Type  Chronic pain    Pain Onset  More than a month ago    Pain Frequency  Intermittent         [facility] PT Assessment - 06/15/19 0001      Assessment   Medical Diagnosis  Right MPFL reconstruction.    Referring Provider (PT)  Theodosia Quayale Boyd MD.    Onset Date/Surgical Date  11/14/18    Next MD Visit  06/2019      Restrictions   Weight Bearing Restrictions  No                   [facility] Adult PT Treatment/Exercise - 06/15/19 0001      Knee/Hip  Exercises: Aerobic   Nustep  L5 x12 mins LEs only      Knee/Hip Exercises: Standing   Other Standing Knee Exercises  backwards walking down carpeted hallway with emphasis of TKE x1      Knee/Hip Exercises: Supine   Quad Sets  Strengthening;Right;20 reps   5" hold   Short Arc Quad Sets  Right;Other (comment)    Short Arc Quad Sets Limitations  VMS Cocontraction for neuro re-ed    Straight Leg Raises  AROM;Right;2 sets;5 sets      Knee/Hip Exercises: Sidelying   Hip ABduction  --    Clams  R SL hip clam red theraband x20 reps      Knee/Hip Exercises: Prone   Straight Leg Raises  AROM;Right;20 reps      Modalities   Modalities  Geologist, engineeringlectrical Stimulation      Electrical Stimulation   Electrical Stimulation Location  R VMO/Quad    Electrical Stimulation Action  VMS co-contraction     Electrical Stimulation Parameters  10/10, 100 pps, 260 usec,  2 sec ramp x15 mins    Electrical Stimulation Goals  Neuromuscular facilitation                  PT Long Term Goals - 06/08/19 1149      PT LONG TERM GOAL #1   Title  Patient will be independent with HEP    Time  8    Period  Weeks    Status  Achieved      PT LONG TERM GOAL #2   Title  Patient will demonstrate ability to perform 3 SLR independently to improve right quadriceps strength.    Time  8    Period  Weeks    Status  On-going      PT LONG TERM GOAL #3   Title  5/5 right hip knee strength to increase stability for functional tasks.    Time  8    Period  Weeks    Status  On-going      PT LONG TERM GOAL #4   Title  Navigate stairs with no pain.    Time  8    Period  Weeks    Status  On-going   "3/10" without carrying, "8/10" with carrying anything upstairs 06/08/2019           Plan - 06/15/19 0947    Clinical Impression Statement  Patient was able to tolerate treatment well with intermittent reports of right knee pain particularly under the right knee cap. Patient was able to perform adequate SLR with very  minimal extension lag but with pain. Patient denied pain with VMS co-contraction with SAQ. Normal response to modalities upon removal.    Personal Factors and Comorbidities  Comorbidity 1    Comorbidities  H/o patellar dislocations.    Examination-Activity Limitations  Locomotion Level    Stability/Clinical Decision Making  Stable/Uncomplicated    Clinical Decision Making  Low    Rehab Potential  Excellent    PT Frequency  2x / week    PT Duration  8 weeks    PT Treatment/Interventions  ADLs/Self Care Home Management;Electrical Stimulation;Cryotherapy;Moist Heat;Neuromuscular re-education;Therapeutic exercise;Therapeutic activities;Patient/family education;Manual techniques;Vasopneumatic Device    PT Next Visit Plan  FOTO next visit; VMS to right medial quads; hip and pain-free right quad strengthening exercises, modalites as needed.    Consulted and Agree with Plan of Care  Patient       Patient will benefit from skilled therapeutic intervention in order to improve the following deficits and impairments:  Pain, Decreased strength, Decreased activity tolerance  Visit Diagnosis: 1. Chronic pain of right knee   2. Muscle weakness (generalized)        Problem List Patient Active Problem List   Diagnosis Date Noted  . GAD (generalized anxiety disorder) 06/12/2019   Gabriela Eves, PT, DPT 06/15/2019, 9:57 AM  [facility] [place], [place], [address] Phone: [phone]   Fax:  [phone]  Name: Diana Sandoval MRN: [id number] Date of Birth: Apr 08, 2001

## 2019-06-19 ENCOUNTER — Encounter: Payer: Self-pay | Admitting: Physical Therapy

## 2019-06-19 ENCOUNTER — Ambulatory Visit: Payer: 59 | Admitting: Physical Therapy

## 2019-06-19 ENCOUNTER — Other Ambulatory Visit: Payer: Self-pay

## 2019-06-19 DIAGNOSIS — G8929 Other chronic pain: Secondary | ICD-10-CM

## 2019-06-19 DIAGNOSIS — M25661 Stiffness of right knee, not elsewhere classified: Secondary | ICD-10-CM

## 2019-06-19 DIAGNOSIS — M25561 Pain in right knee: Secondary | ICD-10-CM | POA: Diagnosis not present

## 2019-06-19 DIAGNOSIS — M6281 Muscle weakness (generalized): Secondary | ICD-10-CM

## 2019-06-19 NOTE — Therapy (Signed)
[facility] [place], [place], [address] Phone: [phone]   Fax:  [phone]  Physical Therapy Treatment  Patient Details  Name: Diana Sandoval MRN: [id number] Date of Birth: 2001/07/03 Referring Provider (PT): Alferd Patee MD.   Encounter Date: 06/19/2019  PT End of Session - 06/19/19 0918    Visit Number  6    Number of Visits  16    Date for PT Re-Evaluation  07/13/19    Authorization Type  FOTO AT LEAST EVERY 5TH VISIT.  PROGRESS NOTE AT 10TH VISIT.    PT Start Time  0900    PT Stop Time  0945    PT Time Calculation (min)  45 min    Activity Tolerance  Patient tolerated treatment well    Behavior During Therapy  WFL for tasks assessed/performed       Past Medical History:  Diagnosis Date  . Anxiety   . Depression     Past Surgical History:  Procedure Laterality Date  . medial patellofemoral ligament reconstruction Right 11/14/2018    There were no vitals filed for this visit.  Subjective Assessment - 06/19/19 0917    Subjective  COVID 19 screening performed on patient upon arrival. Patient arrives with no reports of pain.    Pertinent History  H/o patellar dislocations.    Limitations  Walking    Patient Stated Goals  Strengthen knee so it won't pop out.         [facility] PT Assessment - 06/19/19 0001      Assessment   Medical Diagnosis  Right MPFL reconstruction.    Referring Provider (PT)  Alferd Patee MD.    Next MD Visit  06/2019, may move date to August      Restrictions   Weight Bearing Restrictions  No                   [facility] Adult PT Treatment/Exercise - 06/19/19 0001      Knee/Hip Exercises: Aerobic   Nustep  L5 x12 mins LEs only      Knee/Hip Exercises: Standing   Terminal Knee Extension  Strengthening;Right;2 sets;10 reps    Terminal Knee Extension Limitations  Green XTS    Walking with Sports Cord  backwards walking with green XTS      Knee/Hip Exercises: Supine   Short  Arc Proofreader (comment)    Short Arc Quad Sets Limitations  VMS Cocontraction for neuro re-ed      Knee/Hip Exercises: Sidelying   Hip ABduction  AROM;Right;20 reps      Modalities   Modalities  Teacher, English as a foreign language Location  R VMO/Quad    Electrical Stimulation Action  VMS Co-contraction    Electrical Stimulation Parameters  10/10, 100 pps, 260 usec,  x15 min    Electrical Stimulation Goals  Neuromuscular facilitation                  PT Long Term Goals - 06/08/19 1149      PT LONG TERM GOAL #1   Title  Patient will be independent with HEP    Time  8    Period  Weeks    Status  Achieved      PT LONG TERM GOAL #2   Title  Patient will demonstrate ability to perform 3 SLR independently to improve right quadriceps strength.    Time  8  Period  Weeks    Status  On-going      PT LONG TERM GOAL #3   Title  5/5 right hip knee strength to increase stability for functional tasks.    Time  8    Period  Weeks    Status  On-going      PT LONG TERM GOAL #4   Title  Navigate stairs with no pain.    Time  8    Period  Weeks    Status  On-going   "3/10" without carrying, "8/10" with carrying anything upstairs 06/08/2019           Plan - 06/19/19 1044    Clinical Impression Statement  Patient arrives to physical therapy feeling well with no reports of pain at the start of session. Patient was able to perform progression of exercises but with did report slight pain under the right knee cap. Patient provided with tactile cues for proper form with SLR to prevent hip flexion. Normal response to VMS with good visualization of muscle contraction.    Personal Factors and Comorbidities  Comorbidity 1    Comorbidities  H/o patellar dislocations.    Examination-Activity Limitations  Locomotion Level    Stability/Clinical Decision Making  Stable/Uncomplicated    Clinical Decision Making  Low    Rehab  Potential  Excellent    PT Frequency  2x / week    PT Duration  8 weeks    PT Treatment/Interventions  ADLs/Self Care Home Management;Electrical Stimulation;Cryotherapy;Moist Heat;Neuromuscular re-education;Therapeutic exercise;Therapeutic activities;Patient/family education;Manual techniques;Vasopneumatic Device    PT Next Visit Plan  VMS to right medial quads; hip and pain-free right quad strengthening exercises, modalites as needed.    Consulted and Agree with Plan of Care  Patient       Patient will benefit from skilled therapeutic intervention in order to improve the following deficits and impairments:  Pain, Decreased strength, Decreased activity tolerance  Visit Diagnosis: 1. Chronic pain of right knee   2. Muscle weakness (generalized)   3. Stiffness of right knee, not elsewhere classified        Problem List Patient Active Problem List   Diagnosis Date Noted  . GAD (generalized anxiety disorder) 06/12/2019    Guss BundeKrystle Balthazar Dooly, PT, DPT 06/19/2019, 11:04 AM  [facility] [address] [place], [place], [address] Phone: [phone]   Fax:  [phone]  Name: Diana Sandoval MRN: [id number] Date of Birth: 2000-12-24

## 2019-06-22 ENCOUNTER — Other Ambulatory Visit: Payer: Self-pay

## 2019-06-22 ENCOUNTER — Encounter: Payer: Self-pay | Admitting: Physical Therapy

## 2019-06-22 ENCOUNTER — Ambulatory Visit: Payer: 59 | Admitting: Physical Therapy

## 2019-06-22 DIAGNOSIS — M25661 Stiffness of right knee, not elsewhere classified: Secondary | ICD-10-CM

## 2019-06-22 DIAGNOSIS — M6281 Muscle weakness (generalized): Secondary | ICD-10-CM

## 2019-06-22 DIAGNOSIS — G8929 Other chronic pain: Secondary | ICD-10-CM

## 2019-06-22 DIAGNOSIS — M25561 Pain in right knee: Secondary | ICD-10-CM | POA: Diagnosis not present

## 2019-06-22 NOTE — Therapy (Signed)
[facility] [place], [place], [address] Phone: [phone]   Fax:  [phone]  Physical Therapy Treatment  Patient Details  Name: Diana Sandoval MRN: [id number] Date of Birth: 2001/11/28 Referring Provider (PT): Alferd Patee MD.   Encounter Date: 06/22/2019  PT End of Session - 06/22/19 0936    Visit Number  7    Number of Visits  16    Date for PT Re-Evaluation  07/13/19    Authorization Type  FOTO AT LEAST EVERY 5TH VISIT.  PROGRESS NOTE AT 10TH VISIT.    PT Start Time  0903    PT Stop Time  0951    PT Time Calculation (min)  48 min    Activity Tolerance  Patient tolerated treatment well    Behavior During Therapy  [facility] for tasks assessed/performed       Past Medical History:  Diagnosis Date  . Anxiety   . Depression     Past Surgical History:  Procedure Laterality Date  . medial patellofemoral ligament reconstruction Right 11/14/2018    There were no vitals filed for this visit.  Subjective Assessment - 06/22/19 0934    Subjective  COVID 19 screening performed on patient upon arrival. Patient reports working a total of 40 hours last week with 3 8 hour shifts in a row. Patient reports taking OTC pain medication to assist with pain and spoke with her boss at work to limit work schedule.    Pertinent History  H/o patellar dislocations.    Limitations  Walking    Patient Stated Goals  Strengthen knee so it won't pop out.    Currently in Pain?  Yes    Pain Score  3     Pain Location  Knee    Pain Orientation  Right    Pain Descriptors / Indicators  Discomfort    Pain Type  Chronic pain    Pain Onset  More than a month ago         [facility] PT Assessment - 06/22/19 0001      Assessment   Medical Diagnosis  Right MPFL reconstruction.    Referring Provider (PT)  Alferd Patee MD.    Onset Date/Surgical Date  11/14/18    Next MD Visit  06/2019, may move date to August      Restrictions   Weight Bearing  Restrictions  No                   [facility] Adult PT Treatment/Exercise - 06/22/19 0001      Knee/Hip Exercises: Aerobic   Nustep  L5 x12 mins LEs only      Knee/Hip Exercises: Standing   Terminal Knee Extension  Strengthening;Right;2 sets;10 reps    Terminal Knee Extension Limitations  Green XTS    Walking with Sports Cord  backwards walking with green XTS    Other Standing Knee Exercises  [employer] pews x15 reps      Knee/Hip Exercises: Supine   Short Arc Quad Sets  Right;Other (comment)    Short Arc Quad Sets Limitations  VMS cocontract for neuro re-ed      Modalities   Modalities  Psychologist, educational Location  R VMO/Quad    Printmaker Action  VMS cocontract with SAQ    Electrical Stimulation Parameters  10/10, 50 pps, 2 sec, 300 usec x10 min    Electrical Stimulation Goals  Neuromuscular facilitation  Vasopneumatic   Number Minutes Vasopneumatic   15 minutes    Vasopnuematic Location   Knee    Vasopneumatic Pressure  Medium    Vasopneumatic Temperature   53                  PT Long Term Goals - 06/08/19 1149      PT LONG TERM GOAL #1   Title  Patient will be independent with HEP    Time  8    Period  Weeks    Status  Achieved      PT LONG TERM GOAL #2   Title  Patient will demonstrate ability to perform 3 SLR independently to improve right quadriceps strength.    Time  8    Period  Weeks    Status  On-going      PT LONG TERM GOAL #3   Title  5/5 right hip knee strength to increase stability for functional tasks.    Time  8    Period  Weeks    Status  On-going      PT LONG TERM GOAL #4   Title  Navigate stairs with no pain.    Time  8    Period  Weeks    Status  On-going   "3/10" without carrying, "8/10" with carrying anything upstairs 06/08/2019           Plan - 06/22/19 0944    Clinical Impression Statement  Patient presented in clinic with  moderate edema at superior aspect of R knee and increased discomfort over the weekend from working. Patient reports having to take OTC pain medication to alleviate pain during shifts. Quad activation was predominate focus of today's treatment with patella movement observed but quad activation still deficient. VCs provided to patient to eccentrically control RLE when returning to rest position. Normal modalities response noted following removal of the modalities.    Personal Factors and Comorbidities  Comorbidity 1    Comorbidities  H/o patellar dislocations.    Examination-Activity Limitations  Locomotion Level    Stability/Clinical Decision Making  Stable/Uncomplicated    Rehab Potential  Excellent    PT Frequency  2x / week    PT Duration  8 weeks    PT Treatment/Interventions  ADLs/Self Care Home Management;Electrical Stimulation;Cryotherapy;Moist Heat;Neuromuscular re-education;Therapeutic exercise;Therapeutic activities;Patient/family education;Manual techniques;Vasopneumatic Device    PT Next Visit Plan  VMS to right medial quads; hip and pain-free right quad strengthening exercises, modalites as needed.    Consulted and Agree with Plan of Care  Patient       Patient will benefit from skilled therapeutic intervention in order to improve the following deficits and impairments:  Pain, Decreased strength, Decreased activity tolerance  Visit Diagnosis: 1. Chronic pain of right knee   2. Muscle weakness (generalized)   3. Stiffness of right knee, not elsewhere classified        Problem List Patient Active Problem List   Diagnosis Date Noted  . GAD (generalized anxiety disorder) 06/12/2019    Marvell FullerKelsey P Shacoya Burkhammer, PTA 06/22/2019, 10:49 AM  [facility] [address] [place], [place], [address] Phone: [phone]   Fax:  [phone]  Name: Preston FleetingSydnee G Skousen MRN: [id number] Date of Birth: 2001-05-02

## 2019-06-24 ENCOUNTER — Other Ambulatory Visit: Payer: Self-pay

## 2019-06-24 ENCOUNTER — Encounter: Payer: Self-pay | Admitting: Physical Therapy

## 2019-06-24 ENCOUNTER — Ambulatory Visit: Payer: 59 | Admitting: Physical Therapy

## 2019-06-24 DIAGNOSIS — M25661 Stiffness of right knee, not elsewhere classified: Secondary | ICD-10-CM

## 2019-06-24 DIAGNOSIS — G8929 Other chronic pain: Secondary | ICD-10-CM

## 2019-06-24 DIAGNOSIS — M25561 Pain in right knee: Secondary | ICD-10-CM | POA: Diagnosis not present

## 2019-06-24 DIAGNOSIS — M6281 Muscle weakness (generalized): Secondary | ICD-10-CM

## 2019-06-24 NOTE — Therapy (Signed)
[facility] [address] [place], [place], [address] Phone: [phone]   Fax:  [phone]  Physical Therapy Treatment  Patient Details  Name: Preston FleetingSydnee G Younis MRN: [id number] Date of Birth: [birth year] Referring Provider (PT): Theodosia Quayale Boyd MD.   Encounter Date: 06/24/2019  PT End of Session - 06/24/19 1040    Visit Number  8    Number of Visits  16    Date for PT Re-Evaluation  07/13/19    Authorization Type  FOTO AT LEAST EVERY 5TH VISIT.  PROGRESS NOTE AT 10TH VISIT.    PT Start Time  1032    PT Stop Time  1115    PT Time Calculation (min)  43 min    Activity Tolerance  Patient tolerated treatment well    Behavior During Therapy  WFL for tasks assessed/performed       Past Medical History:  Diagnosis Date  . Anxiety   . Depression     Past Surgical History:  Procedure Laterality Date  . medial patellofemoral ligament reconstruction Right 11/14/2018    There were no vitals filed for this visit.  Subjective Assessment - 06/24/19 1039    Subjective  COVID 19 screening performed on patient upon arrival. No complaints today upon arrival.    Pertinent History  H/o patellar dislocations.    Limitations  Walking    Patient Stated Goals  Strengthen knee so it won't pop out.    Currently in Pain?  Yes    Pain Score  2     Pain Location  Knee    Pain Orientation  Right    Pain Descriptors / Indicators  Discomfort    Pain Type  Chronic pain    Pain Onset  More than a month ago    Pain Frequency  Intermittent         [facility] PT Assessment - 06/24/19 0001      Assessment   Medical Diagnosis  Right MPFL reconstruction.    Referring Provider (PT)  Theodosia Quayale Boyd MD.    Onset Date/Surgical Date  11/14/18    Next MD Visit  06/2019, may move date to August      Restrictions   Weight Bearing Restrictions  No                   [facility] Adult PT Treatment/Exercise - 06/24/19 0001      Knee/Hip Exercises: Aerobic   Recumbent  Bike  L4 x12 min      Knee/Hip Exercises: Standing   Walking with Sports Cord  Backward, B sidestepping x15 reps each orange XTS      Knee/Hip Exercises: Supine   Bridges with Ball Squeeze  Strengthening;20 reps   4# ball squeeze   Straight Leg Raises  AROM;Right;20 reps    Straight Leg Raises Limitations  with VMS as well to further enforce neuro re-ed of L quad      Modalities   Modalities  Electrical Stimulation;Vasopneumatic      Electrical Stimulation   Electrical Stimulation Location  R VMO/Quad    Electrical Stimulation Action  VMS cocontract with SLR    Electrical Stimulation Parameters  10/10, 50 pps, 2 sec ramp, 50% x15 min    Electrical Stimulation Goals  Neuromuscular facilitation                  PT Long Term Goals - 06/24/19 1056      PT LONG TERM GOAL #1   Title  Patient will be independent with HEP    Time  8    Period  Weeks    Status  Achieved      PT LONG TERM GOAL #2   Title  Patient will demonstrate ability to perform 3 SLR independently to improve right quadriceps strength.    Time  8    Period  Weeks    Status  Achieved      PT LONG TERM GOAL #3   Title  5/5 right hip knee strength to increase stability for functional tasks.    Time  8    Period  Weeks    Status  On-going      PT LONG TERM GOAL #4   Title  Navigate stairs with no pain.    Time  8    Period  Weeks    Status  On-going   "3/10" without carrying, "8/10" with carrying anything upstairs 06/08/2019           Plan - 06/24/19 1108    Clinical Impression Statement  Patient presented in clinic with low level R knee pain. Patient progressed to more resisted hip strengthening today. Assessment of R SLR completed today in which patient able to complete reps without pain. Visible R quad contraction still deficient with painfree SLRs. Normal VMS response noted following end of the session.    Personal Factors and Comorbidities  Comorbidity 1    Comorbidities  H/o patellar  dislocations.    Examination-Activity Limitations  Locomotion Level    Stability/Clinical Decision Making  Stable/Uncomplicated    Rehab Potential  Excellent    PT Frequency  2x / week    PT Duration  8 weeks    PT Treatment/Interventions  ADLs/Self Care Home Management;Electrical Stimulation;Cryotherapy;Moist Heat;Neuromuscular re-education;Therapeutic exercise;Therapeutic activities;Patient/family education;Manual techniques;Vasopneumatic Device    PT Next Visit Plan  VMS to right medial quads; hip and pain-free right quad strengthening exercises, modalites as needed.    Consulted and Agree with Plan of Care  Patient       Patient will benefit from skilled therapeutic intervention in order to improve the following deficits and impairments:  Pain, Decreased strength, Decreased activity tolerance  Visit Diagnosis: 1. Chronic pain of right knee   2. Muscle weakness (generalized)   3. Stiffness of right knee, not elsewhere classified        Problem List Patient Active Problem List   Diagnosis Date Noted  . GAD (generalized anxiety disorder) 06/12/2019    Standley Brooking, PTA 06/24/2019, 12:23 PM  [facility] [place], [place], [address] Phone: [phone]   Fax:  [phone]  Name: HAYDEE JABBOUR MRN: [id number] Date of Birth: 07/20/2001

## 2019-06-30 ENCOUNTER — Ambulatory Visit: Payer: 59 | Admitting: Physical Therapy

## 2019-06-30 ENCOUNTER — Encounter: Payer: Self-pay | Admitting: Physical Therapy

## 2019-06-30 ENCOUNTER — Other Ambulatory Visit: Payer: Self-pay

## 2019-06-30 DIAGNOSIS — M6281 Muscle weakness (generalized): Secondary | ICD-10-CM

## 2019-06-30 DIAGNOSIS — G8929 Other chronic pain: Secondary | ICD-10-CM

## 2019-06-30 DIAGNOSIS — M25661 Stiffness of right knee, not elsewhere classified: Secondary | ICD-10-CM

## 2019-06-30 DIAGNOSIS — M25561 Pain in right knee: Secondary | ICD-10-CM | POA: Diagnosis not present

## 2019-06-30 NOTE — Therapy (Signed)
[facility] [address] [place], [place], [address] Phone: [phone]   Fax:  [phone]  Physical Therapy Treatment  Patient Details  Name: Diana Sandoval MRN: [id number] Date of Birth: 2001/02/01 Referring Provider (PT): Theodosia Quayale Boyd MD.   Encounter Date: 06/30/2019  PT End of Session - 06/30/19 1014    Visit Number  9    Number of Visits  16    Date for PT Re-Evaluation  07/13/19    Authorization Type  FOTO AT LEAST EVERY 5TH VISIT.  PROGRESS NOTE AT 10TH VISIT.    PT Start Time  [phone]    PT Stop Time  1041    PT Time Calculation (min)  53 min    Activity Tolerance  Patient tolerated treatment well    Behavior During Therapy  WFL for tasks assessed/performed       Past Medical History:  Diagnosis Date  . Anxiety   . Depression     Past Surgical History:  Procedure Laterality Date  . medial patellofemoral ligament reconstruction Right 11/14/2018    There were no vitals filed for this visit.  Subjective Assessment - 06/30/19 1013    Subjective  COVID 19 screening performed on patient upon arrival. Reports that she worked all weekend and had excruciating pain on Saturday.    Pertinent History  H/o patellar dislocations.    Limitations  Walking    Patient Stated Goals  Strengthen knee so it won't pop out.    Currently in Pain?  Yes    Pain Score  5     Pain Location  Knee    Pain Orientation  Right    Pain Descriptors / Indicators  Sharp    Pain Type  Chronic pain    Pain Onset  More than a month ago    Pain Frequency  Intermittent         [facility] PT Assessment - 06/30/19 0001      Assessment   Medical Diagnosis  Right MPFL reconstruction.    Referring Provider (PT)  Theodosia Quayale Boyd MD.    Onset Date/Surgical Date  11/14/18    Next MD Visit  06/2019, may move date to August      Restrictions   Weight Bearing Restrictions  No                   [facility] Adult PT Treatment/Exercise - 06/30/19 0001      Knee/Hip Exercises: Aerobic   Recumbent Bike  L4 x12 min      Knee/Hip Exercises: Standing   Heel Raises Limitations  [employer] pews/ toe raise x20 reps    Terminal Knee Extension  Strengthening;Right;20 reps;Theraband    Theraband Level (Terminal Knee Extension)  Level 3 (Green)      Knee/Hip Exercises: Supine   Straight Leg Raises Limitations  with VMS as well to further enforce neuro re-ed of L quad      Knee/Hip Exercises: Sidelying   Hip ABduction  AROM;Right;20 reps      Knee/Hip Exercises: Prone   Straight Leg Raises  AROM;Right;20 reps    Other Prone Exercises  Prone R QS x20 reps      Modalities   Modalities  Electrical Stimulation;Vasopneumatic      Electrical Stimulation   Electrical Stimulation Location  R VMO/Quad with SLR    Electrical Stimulation Action  VMS cocontract    Electrical Stimulation Parameters  10/10, 50 pps, 2 sec, 300 usec x10 min  Electrical Stimulation Goals  Neuromuscular facilitation      Vasopneumatic   Number Minutes Vasopneumatic   10 minutes    Vasopnuematic Location   Knee    Vasopneumatic Pressure  Medium    Vasopneumatic Temperature   34                  PT Long Term Goals - 06/24/19 1056      PT LONG TERM GOAL #1   Title  Patient will be independent with HEP    Time  8    Period  Weeks    Status  Achieved      PT LONG TERM GOAL #2   Title  Patient will demonstrate ability to perform 3 SLR independently to improve right quadriceps strength.    Time  8    Period  Weeks    Status  Achieved      PT LONG TERM GOAL #3   Title  5/5 right hip knee strength to increase stability for functional tasks.    Time  8    Period  Weeks    Status  On-going      PT LONG TERM GOAL #4   Title  Navigate stairs with no pain.    Time  8    Period  Weeks    Status  On-going   "3/10" without carrying, "8/10" with carrying anything upstairs 06/08/2019           Plan - 06/30/19 1032    Clinical Impression Statement  Patient  presented in clinic with reports of increased R knee pain and edema since this weekend after working several longer shifts. Patient able to demonstrate a minimally improved volitional contraction of R quad. More focus on active quad strengthening although VMS continued to progress neuro re-ed. Moderate edema noted surrounding the R patella while in clinic. Normal modalities response noted following removal of the modalities.    Personal Factors and Comorbidities  Comorbidity 1    Comorbidities  H/o patellar dislocations.    Examination-Activity Limitations  Locomotion Level    Stability/Clinical Decision Making  Stable/Uncomplicated    Rehab Potential  Excellent    PT Frequency  2x / week    PT Duration  8 weeks    PT Treatment/Interventions  ADLs/Self Care Home Management;Electrical Stimulation;Cryotherapy;Moist Heat;Neuromuscular re-education;Therapeutic exercise;Therapeutic activities;Patient/family education;Manual techniques;Vasopneumatic Device    PT Next Visit Plan  VMS to right medial quads; hip and pain-free right quad strengthening exercises, modalites as needed.    Consulted and Agree with Plan of Care  Patient       Patient will benefit from skilled therapeutic intervention in order to improve the following deficits and impairments:  Pain, Decreased strength, Decreased activity tolerance  Visit Diagnosis: 1. Chronic pain of right knee   2. Muscle weakness (generalized)   3. Stiffness of right knee, not elsewhere classified        Problem List Patient Active Problem List   Diagnosis Date Noted  . GAD (generalized anxiety disorder) 06/12/2019    Standley Brooking, PTA 06/30/2019, 10:48 AM  [facility] [address] [place], [place], [address] Phone: [phone]   Fax:  [phone]  Name: Diana Sandoval MRN: [id number] Date of Birth: 11/08/2001

## 2019-07-03 ENCOUNTER — Ambulatory Visit: Payer: 59 | Admitting: Physical Therapy

## 2019-07-03 ENCOUNTER — Other Ambulatory Visit: Payer: Self-pay

## 2019-07-03 DIAGNOSIS — M25561 Pain in right knee: Secondary | ICD-10-CM | POA: Diagnosis not present

## 2019-07-03 DIAGNOSIS — M6281 Muscle weakness (generalized): Secondary | ICD-10-CM

## 2019-07-03 DIAGNOSIS — G8929 Other chronic pain: Secondary | ICD-10-CM

## 2019-07-03 DIAGNOSIS — M25661 Stiffness of right knee, not elsewhere classified: Secondary | ICD-10-CM

## 2019-07-03 NOTE — Therapy (Signed)
[facility] [place], [place], [address] Phone: [phone]   Fax:  [phone]  Physical Therapy Treatment  Patient Details  Name: Diana Sandoval MRN: [id number] Date of Birth: 04-09-2001 Referring Provider (PT): Alferd Patee MD.   Encounter Date: 07/03/2019  PT End of Session - 07/03/19 0936    Visit Number  10    Number of Visits  16    Date for PT Re-Evaluation  07/13/19    Authorization Type  FOTO AT LEAST EVERY 5TH VISIT.  PROGRESS NOTE AT 10TH VISIT.    PT Start Time  0902    PT Stop Time  0944    PT Time Calculation (min)  42 min    Activity Tolerance  Patient tolerated treatment well    Behavior During Therapy  [facility] for tasks assessed/performed       Past Medical History:  Diagnosis Date  . Anxiety   . Depression     Past Surgical History:  Procedure Laterality Date  . medial patellofemoral ligament reconstruction Right 11/14/2018    There were no vitals filed for this visit.  Subjective Assessment - 07/03/19 0915    Subjective  COVID 19 screening performed on patient upon arrival. "Fell in shower the other day and fell on knee"    Pertinent History  H/o patellar dislocations.    Limitations  Walking    Patient Stated Goals  Strengthen knee so it won't pop out.    Currently in Pain?  No/denies                       [facility] Adult PT Treatment/Exercise - 07/03/19 0001      Knee/Hip Exercises: Aerobic   Recumbent Bike  L4 x12 min      Knee/Hip Exercises: Standing   Terminal Knee Extension  Strengthening;Right;20 reps;Theraband    Theraband Level (Terminal Knee Extension)  Level 3 (Green)      Knee/Hip Exercises: Supine   Bridges with Clamshell  Strengthening;Both;20 reps;10 reps   red t-band     Acupuncturist Location  R VMO/Quad with SLR    Electrical Stimulation Action  VMS to VMO quad w SLR for activation 10/10 x58min    Electrical Stimulation  Goals  Neuromuscular facilitation;Strength      Vasopneumatic   Number Minutes Vasopneumatic   10 minutes    Vasopnuematic Location   Knee    Vasopneumatic Pressure  Medium                  PT Long Term Goals - 06/24/19 1056      PT LONG TERM GOAL #1   Title  Patient will be independent with HEP    Time  8    Period  Weeks    Status  Achieved      PT LONG TERM GOAL #2   Title  Patient will demonstrate ability to perform 3 SLR independently to improve right quadriceps strength.    Time  8    Period  Weeks    Status  Achieved      PT LONG TERM GOAL #3   Title  5/5 right hip knee strength to increase stability for functional tasks.    Time  8    Period  Weeks    Status  On-going      PT LONG TERM GOAL #4   Title  Navigate stairs with no pain.  Time  8    Period  Weeks    Status  On-going   "3/10" without carrying, "8/10" with carrying anything upstairs 06/08/2019           Plan - 07/03/19 0953    Clinical Impression Statement  Patient tolerated treatment well today. Patient will be working 5 days straight starting tonight so today focused on less exercises to aviod soreness for her shift. Patient progressing with no pain today and only has increased pain from work activities. Patient goals progressing.    Personal Factors and Comorbidities  Comorbidity 1    Comorbidities  H/o patellar dislocations.    Examination-Activity Limitations  Locomotion Level    Stability/Clinical Decision Making  Stable/Uncomplicated    Rehab Potential  Excellent    PT Frequency  2x / week    PT Duration  8 weeks    PT Treatment/Interventions  ADLs/Self Care Home Management;Electrical Stimulation;Cryotherapy;Moist Heat;Neuromuscular re-education;Therapeutic exercise;Therapeutic activities;Patient/family education;Manual techniques;Vasopneumatic Device    PT Next Visit Plan  VMS to right medial quads; hip and pain-free right quad strengthening exercises, modalites as needed.     Consulted and Agree with Plan of Care  Patient       Patient will benefit from skilled therapeutic intervention in order to improve the following deficits and impairments:  Pain, Decreased strength, Decreased activity tolerance  Visit Diagnosis: 1. Chronic pain of right knee   2. Muscle weakness (generalized)   3. Stiffness of right knee, not elsewhere classified        Problem List Patient Active Problem List   Diagnosis Date Noted  . GAD (generalized anxiety disorder) 06/12/2019    Hermelinda DellenDUNFORD, Tolulope Pinkett P, PTA 07/03/2019, 9:56 AM  [facility] [address] [place], [place], [address] Phone: [phone]   Fax:  [phone]  Name: Diana Sandoval MRN: [id number] Date of Birth: 28-May-2001

## 2019-07-07 ENCOUNTER — Ambulatory Visit: Payer: 59 | Attending: Orthopedic Surgery | Admitting: Physical Therapy

## 2019-07-07 ENCOUNTER — Other Ambulatory Visit: Payer: Self-pay

## 2019-07-07 ENCOUNTER — Encounter: Payer: Self-pay | Admitting: Physical Therapy

## 2019-07-07 DIAGNOSIS — M25661 Stiffness of right knee, not elsewhere classified: Secondary | ICD-10-CM

## 2019-07-07 DIAGNOSIS — M25561 Pain in right knee: Secondary | ICD-10-CM | POA: Insufficient documentation

## 2019-07-07 DIAGNOSIS — M6281 Muscle weakness (generalized): Secondary | ICD-10-CM

## 2019-07-07 DIAGNOSIS — G8929 Other chronic pain: Secondary | ICD-10-CM

## 2019-07-07 NOTE — Therapy (Signed)
[facility] [place], [place], [address] Phone: [phone]   Fax:  [phone]  Physical Therapy Treatment  Patient Details  Name: Diana Sandoval MRN: [id number] Date of Birth: [birth year] Referring Provider (PT): Alferd Patee MD.   Encounter Date: 07/07/2019  PT End of Session - 07/07/19 0819    Visit Number  11    Number of Visits  16    Date for PT Re-Evaluation  07/13/19    Authorization Type  FOTO AT LEAST EVERY 5TH VISIT.  PROGRESS NOTE AT 10TH VISIT.    PT Start Time  [id number]    PT Stop Time  0902    PT Time Calculation (min)  45 min    Activity Tolerance  Patient tolerated treatment well    Behavior During Therapy  [facility] for tasks assessed/performed       Past Medical History:  Diagnosis Date  . Anxiety   . Depression     Past Surgical History:  Procedure Laterality Date  . medial patellofemoral ligament reconstruction Right 11/14/2018    There were no vitals filed for this visit.  Subjective Assessment - 07/07/19 0818    Subjective  COVID 19 screening performed on patient upon arrival. Reports soreness after working four days in a row.    Pertinent History  H/o patellar dislocations.    Limitations  Walking    Patient Stated Goals  Strengthen knee so it won't pop out.    Currently in Pain?  Yes    Pain Score  3     Pain Location  Knee    Pain Orientation  Right    Pain Descriptors / Indicators  Sore    Pain Type  Chronic pain    Pain Onset  More than a month ago         [facility] PT Assessment - 07/07/19 0001      Assessment   Medical Diagnosis  Right MPFL reconstruction.    Referring Provider (PT)  Alferd Patee MD.    Onset Date/Surgical Date  11/14/18    Next MD Visit  TBD      Restrictions   Weight Bearing Restrictions  No                   [facility] Adult PT Treatment/Exercise - 07/07/19 0001      Knee/Hip Exercises: Aerobic   Recumbent Bike  L4 x10 min      Knee/Hip Exercises:  Machines for Strengthening   Cybex Leg Press  2.5 PL, SEAT 6 X20 REPS      Knee/Hip Exercises: Seated   Long Arc Quad  Strengthening;Right;20 reps;Weights    Long Arc Quad Weight  4 lbs.      Knee/Hip Exercises: Supine   Bridges with Clamshell  Strengthening;Both;20 reps;Limitations   green theraband   Straight Leg Raises  AROM;Right;20 reps    Straight Leg Raise with External Rotation  AROM;Right;15 reps      Knee/Hip Exercises: Sidelying   Hip ABduction  AROM;Right;20 reps    Clams  R clam x20 reps      Knee/Hip Exercises: Prone   Hip Extension  AROM;Right;20 reps      Modalities   Modalities  Vasopneumatic      Vasopneumatic   Number Minutes Vasopneumatic   15 minutes    Vasopnuematic Location   Knee    Vasopneumatic Pressure  Medium    Vasopneumatic Temperature   34  PT Long Term Goals - 06/24/19 1056      PT LONG TERM GOAL #1   Title  Patient will be independent with HEP    Time  8    Period  Weeks    Status  Achieved      PT LONG TERM GOAL #2   Title  Patient will demonstrate ability to perform 3 SLR independently to improve right quadriceps strength.    Time  8    Period  Weeks    Status  Achieved      PT LONG TERM GOAL #3   Title  5/5 right hip knee strength to increase stability for functional tasks.    Time  8    Period  Weeks    Status  On-going      PT LONG TERM GOAL #4   Title  Navigate stairs with no pain.    Time  8    Period  Weeks    Status  On-going   "3/10" without carrying, "8/10" with carrying anything upstairs 06/08/2019           Plan - 07/07/19 0902    Clinical Impression Statement  Patient arrived in clinic with blue bruises over R ITB from recent fall in the shower and soreness in R knee from working consecutive days. More VCs for toe raise during exercises to enforce quad activation. Patient did report 7-8/10 R inferior knee pain with SLR. No complaints with any other therex. Minimally increased  edema noted in R knee today. Normal vaospnuematic response noted following removal of the modality.    Personal Factors and Comorbidities  Comorbidity 1    Comorbidities  H/o patellar dislocations.    Examination-Activity Limitations  Locomotion Level    Stability/Clinical Decision Making  Stable/Uncomplicated    Rehab Potential  Excellent    PT Frequency  2x / week    PT Duration  8 weeks    PT Treatment/Interventions  ADLs/Self Care Home Management;Electrical Stimulation;Cryotherapy;Moist Heat;Neuromuscular re-education;Therapeutic exercise;Therapeutic activities;Patient/family education;Manual techniques;Vasopneumatic Device    PT Next Visit Plan  VMS to right medial quads; hip and pain-free right quad strengthening exercises, modalites as needed.    Consulted and Agree with Plan of Care  Patient       Patient will benefit from skilled therapeutic intervention in order to improve the following deficits and impairments:  Pain, Decreased strength, Decreased activity tolerance  Visit Diagnosis: 1. Chronic pain of right knee   2. Muscle weakness (generalized)   3. Stiffness of right knee, not elsewhere classified        Problem List Patient Active Problem List   Diagnosis Date Noted  . GAD (generalized anxiety disorder) 06/12/2019    Marvell FullerKelsey P Kennon, PTA 07/07/2019, 9:55 AM  [facility] [address] [place], [place], [address] Phone: [phone]   Fax:  [phone]  Name: Diana Sandoval MRN: [id number] Date of Birth: 2001/07/03

## 2019-07-09 ENCOUNTER — Encounter: Payer: Self-pay | Admitting: Physical Therapy

## 2019-07-09 ENCOUNTER — Other Ambulatory Visit: Payer: Self-pay

## 2019-07-09 ENCOUNTER — Ambulatory Visit: Payer: 59 | Admitting: Physical Therapy

## 2019-07-09 DIAGNOSIS — M6281 Muscle weakness (generalized): Secondary | ICD-10-CM

## 2019-07-09 DIAGNOSIS — M25561 Pain in right knee: Secondary | ICD-10-CM

## 2019-07-09 DIAGNOSIS — G8929 Other chronic pain: Secondary | ICD-10-CM

## 2019-07-09 DIAGNOSIS — M25661 Stiffness of right knee, not elsewhere classified: Secondary | ICD-10-CM

## 2019-07-09 NOTE — Therapy (Signed)
[facility] [address] [place], [place], [address] Phone: [phone]   Fax:  [phone]  Physical Therapy Treatment  Patient Details  Name: Diana FleetingSydnee G Brosnahan MRN: [id number] Date of Birth: 2001/03/01 Referring Provider (PT): Theodosia Quayale Boyd MD.   Encounter Date: 07/09/2019  PT End of Session - 07/09/19 0846    Visit Number  12    Number of Visits  16    Date for PT Re-Evaluation  07/13/19    Authorization Type  FOTO AT LEAST EVERY 5TH VISIT.  PROGRESS NOTE AT 10TH VISIT.    PT Start Time  0817    PT Stop Time  0905    PT Time Calculation (min)  48 min    Activity Tolerance  Patient tolerated treatment well    Behavior During Therapy  [facility]/per[facility]       Past Medical History:  Diagnosis Date  . Anxiety   . Depression     Past Surgical History:  Procedure Laterality Date  . medial patellofemoral ligament reconstruction Right 11/14/2018    There were no vitals filed for this visit.  Subjective Assessment - 07/09/19 0819    Subjective  COVID 19 screening performed on patient upon arrival. Reports some soreness still.    Pertinent History  H/o patellar dislocations.    Limitations  Walking    Patient Stated Goals  Strengthen knee so it won't pop out.    Currently in Pain?  Yes    Pain Score  2     Pain Location  Knee    Pain Orientation  Right    Pain Descriptors / Indicators  Sore    Pain Type  Chronic pain    Pain Onset  More than a month ago    Pain Frequency  Intermittent         [facility] PT Assessment - 07/09/19 0001      Assessment   Medical Diagnosis  Right MPFL reconstruction.    Referring Provider (PT)  Theodosia Quayale Boyd MD.    Onset Date/Surgical Date  11/14/18    Next MD Visit  TBD      Restrictions   Weight Bearing Restrictions  No                   [facility] Adult PT Treatment/Exercise - 07/09/19 0001      Knee/Hip Exercises: Aerobic   Recumbent Bike  L4 x12 min      Knee/Hip  Exercises: Machines for Strengthening   Cybex Leg Press  2.5 PL, SEAT 6 X20 REPS      Knee/Hip Exercises: Seated   Long Arc Quad  Strengthening;Right;20 reps;Weights    Long Arc Quad Weight  2 lbs.    Long [employer] Quad Limitations  crepitus when eccentrically descending      Knee/Hip Exercises: Supine   Bridges with Harley-DavidsonBall Squeeze  Strengthening;20 reps   4# ball   Straight Leg Raises  AROM;Right;20 reps    Straight Leg Raise with External Rotation  AROM;Right;20 reps      Knee/Hip Exercises: Sidelying   Clams  R clam x20 reps      Modalities   Modalities  Vasopneumatic      Vasopneumatic   Number Minutes Vasopneumatic   15 minutes    Vasopnuematic Location   Knee    Vasopneumatic Pressure  Medium    Vasopneumatic Temperature   34  PT Long Term Goals - 06/24/19 1056      PT LONG TERM GOAL #1   Title  Patient will be independent with HEP    Time  8    Period  Weeks    Status  Achieved      PT LONG TERM GOAL #2   Title  Patient will demonstrate ability to perform 3 SLR independently to improve right quadriceps strength.    Time  8    Period  Weeks    Status  Achieved      PT LONG TERM GOAL #3   Title  5/5 right hip knee strength to increase stability for functional tasks.    Time  8    Period  Weeks    Status  On-going      PT LONG TERM GOAL #4   Title  Navigate stairs with no pain.    Time  8    Period  Weeks    Status  On-going   "3/10" without carrying, "8/10" with carrying anything upstairs 06/08/2019           Plan - 07/09/19 0927    Clinical Impression Statement  Patient presented in clinic with low level R knee soreness. Patient guided through quad strengthening with emphasis on quad activation. No complaints in regardings to therex. Normal vasopnuematic response noted following removal of the modality.    Personal Factors and Comorbidities  Comorbidity 1    Comorbidities  H/o patellar dislocations.    Examination-Activity  Limitations  Locomotion Level    Stability/Clinical Decision Making  Stable/Uncomplicated    Rehab Potential  Excellent    PT Frequency  2x / week    PT Duration  8 weeks    PT Treatment/Interventions  ADLs/Self Care Home Management;Electrical Stimulation;Cryotherapy;Moist Heat;Neuromuscular re-education;Therapeutic exercise;Therapeutic activities;Patient/family education;Manual techniques;Vasopneumatic Device    PT Next Visit Plan  VMS to right medial quads; hip and pain-free right quad strengthening exercises, modalites as needed.    Consulted and Agree with Plan of Care  Patient       Patient will benefit from skilled therapeutic intervention in order to improve the following deficits and impairments:  Pain, Decreased strength, Decreased activity tolerance  Visit Diagnosis: 1. Chronic pain of right knee   2. Muscle weakness (generalized)   3. Stiffness of right knee, not elsewhere classified        Problem List Patient Active Problem List   Diagnosis Date Noted  . GAD (generalized anxiety disorder) 06/12/2019    Standley Brooking, PTA 07/09/2019, 9:37 AM  [facility] [address] [place], [place], [address] Phone: [phone]   Fax:  [phone]  Name: Diana Sandoval MRN: [id number] Date of Birth: 07-28-2001

## 2019-07-10 ENCOUNTER — Other Ambulatory Visit: Payer: Self-pay | Admitting: Women's Health

## 2019-07-13 ENCOUNTER — Encounter: Payer: Self-pay | Admitting: Physical Therapy

## 2019-07-13 ENCOUNTER — Other Ambulatory Visit: Payer: Self-pay

## 2019-07-13 ENCOUNTER — Ambulatory Visit: Payer: 59 | Admitting: Physical Therapy

## 2019-07-13 ENCOUNTER — Other Ambulatory Visit: Payer: Self-pay | Admitting: Family Medicine

## 2019-07-13 DIAGNOSIS — F411 Generalized anxiety disorder: Secondary | ICD-10-CM

## 2019-07-13 DIAGNOSIS — M25661 Stiffness of right knee, not elsewhere classified: Secondary | ICD-10-CM

## 2019-07-13 DIAGNOSIS — G8929 Other chronic pain: Secondary | ICD-10-CM

## 2019-07-13 DIAGNOSIS — M25561 Pain in right knee: Secondary | ICD-10-CM | POA: Diagnosis not present

## 2019-07-13 DIAGNOSIS — M6281 Muscle weakness (generalized): Secondary | ICD-10-CM

## 2019-07-13 NOTE — Therapy (Signed)
[facility], [place], [address] Phone: [phone]   Fax:  [phone]  Physical Therapy Treatment  Patient Details  Name: Diana Sandoval MRN: [id number] Date of Birth: January 06, 2001 Referring Provider (PT): Alferd Patee MD.   Encounter Date: 07/13/2019  PT End of Session - 07/13/19 1034    Visit Number  13    Number of Visits  16    Date for PT Re-Evaluation  07/13/19    Authorization Type  FOTO AT LEAST EVERY 5TH VISIT.  PROGRESS NOTE AT 10TH VISIT.    PT Start Time  [phone]    PT Stop Time  1044    PT Time Calculation (min)  55 min    Activity Tolerance  Patient tolerated treatment well    Behavior During Therapy  WFL for tasks assessed/performed       Past Medical History:  Diagnosis Date  . Anxiety   . Depression     Past Surgical History:  Procedure Laterality Date  . medial patellofemoral ligament reconstruction Right 11/14/2018    There were no vitals filed for this visit.  Subjective Assessment - 07/13/19 0951    Subjective  COVID 19 screening performed on patient upon arrival. no pain today per reported    Pertinent History  H/o patellar dislocations.    Limitations  Walking    Patient Stated Goals  Strengthen knee so it won't pop out.    Currently in Pain?  No/denies                       [facility] Adult PT Treatment/Exercise - 07/13/19 0001      Knee/Hip Exercises: Aerobic   Recumbent Bike  L4 x12 min      Knee/Hip Exercises: Machines for Strengthening   Cybex Leg Press  2.5 PL, (SEAT 6) 2X20 REPS      Knee/Hip Exercises: Standing   SLS  attempted SLR yet unable    Other Standing Knee Exercises  standing HS curls x30      Knee/Hip Exercises: Supine   Bridges with Clamshell  Strengthening;Both;20 reps;Limitations   red t-band   Straight Leg Raises  AROM;Right;20 reps    Straight Leg Raise with External Rotation  AROM;Right;20 reps      Knee/Hip Exercises: Sidelying   Clams   R clam x20 reps      Knee/Hip Exercises: Prone   Hip Extension  AROM;Right;20 reps      Vasopneumatic   Number Minutes Vasopneumatic   15 minutes    Vasopnuematic Location   Knee    Vasopneumatic Pressure  Medium    Vasopneumatic Temperature   34                  PT Long Term Goals - 07/13/19 1020      PT LONG TERM GOAL #1   Title  Patient will be independent with HEP    Time  8    Period  Weeks      PT LONG TERM GOAL #2   Title  Patient will demonstrate ability to perform 3 SLR independently to improve right quadriceps strength.    Time  8    Period  Weeks    Status  Achieved      PT LONG TERM GOAL #3   Title  5/5 right hip knee strength to increase stability for functional tasks.    Time  8    Period  Weeks  Status  On-going      PT LONG TERM GOAL #4   Title  Navigate stairs with no pain.    Time  8    Period  Weeks    Status  On-going   ongoing soreness 07/13/19           Plan - 07/13/19 1036    Clinical Impression Statement  Patient tolerated treatment well today. Patient able to progress with exercises today. Patient has no pain today. Patient has instibility with SLS and unable to perform today. Patient has ongoing weakness in right knee. Goals ongoing.    Personal Factors and Comorbidities  Comorbidity 1    Comorbidities  H/o patellar dislocations.    Examination-Activity Limitations  Locomotion Level    Stability/Clinical Decision Making  Stable/Uncomplicated    Rehab Potential  Excellent    PT Frequency  2x / week    PT Duration  8 weeks    PT Treatment/Interventions  ADLs/Self Care Home Management;Electrical Stimulation;Cryotherapy;Moist Heat;Neuromuscular re-education;Therapeutic exercise;Therapeutic activities;Patient/family education;Manual techniques;Vasopneumatic Device    PT Next Visit Plan  VMS to right medial quads; hip and pain-free right quad strengthening exercises, modalites as needed.    Consulted and Agree with Plan of Care   Patient       Patient will benefit from skilled therapeutic intervention in order to improve the following deficits and impairments:  Pain, Decreased strength, Decreased activity tolerance  Visit Diagnosis: 1. Muscle weakness (generalized)   2. Chronic pain of right knee   3. Stiffness of right knee, not elsewhere classified        Problem List Patient Active Problem List   Diagnosis Date Noted  . GAD (generalized anxiety disorder) 06/12/2019    Earlyne Feeser P, PTA 07/13/2019, 10:50 AM  [facility] [address] [place], [place], [address] Phone: [phone]   Fax:  [phone]  Name: Diana Sandoval MRN: [id number] Date of Birth: 2001-04-08

## 2019-07-15 ENCOUNTER — Other Ambulatory Visit: Payer: Self-pay

## 2019-07-15 ENCOUNTER — Encounter: Payer: Self-pay | Admitting: Physical Therapy

## 2019-07-15 ENCOUNTER — Ambulatory Visit: Payer: 59 | Admitting: Physical Therapy

## 2019-07-15 DIAGNOSIS — M25561 Pain in right knee: Secondary | ICD-10-CM

## 2019-07-15 DIAGNOSIS — M6281 Muscle weakness (generalized): Secondary | ICD-10-CM

## 2019-07-15 DIAGNOSIS — G8929 Other chronic pain: Secondary | ICD-10-CM

## 2019-07-15 DIAGNOSIS — M25661 Stiffness of right knee, not elsewhere classified: Secondary | ICD-10-CM

## 2019-07-15 NOTE — Therapy (Signed)
[facility] [place], [place], [address] Phone: [phone]   Fax:  [phone]  Physical Therapy Treatment PHYSICAL THERAPY DISCHARGE SUMMARY  Visits from Start of Care: 14  Current functional level related to goals / functional outcomes: See below   Remaining deficits: See goals   Education / Equipment: HEP Plan: Patient agrees to discharge.  Patient goals were partially met. Patient is being discharged due to the patient's request.  ?????   Patient is returning back to school. Gabriela Eves, PT, DPT   Patient Details  Name: Diana Sandoval MRN: [id number] Date of Birth: [birth year] Referring Provider (PT): Alferd Patee MD.   Encounter Date: 07/15/2019  PT End of Session - 07/15/19 1014    Visit Number  14    Number of Visits  16    Date for PT Re-Evaluation  07/13/19    Authorization Type  FOTO AT LEAST EVERY 5TH VISIT.  PROGRESS NOTE AT 10TH VISIT.    PT Start Time  0945    PT Stop Time  1038    PT Time Calculation (min)  53 min    Activity Tolerance  Patient tolerated treatment well    Behavior During Therapy  WFL for tasks assessed/performed       Past Medical History:  Diagnosis Date  . Anxiety   . Depression     Past Surgical History:  Procedure Laterality Date  . medial patellofemoral ligament reconstruction Right 11/14/2018    There were no vitals filed for this visit.  Subjective Assessment - 07/15/19 1013    Subjective  COVID 19 screening performed on patient upon arrival. Reports 1/10 pain today. Patient to be discharged today to return to school.    Pertinent History  H/o patellar dislocations.    Limitations  Walking    Patient Stated Goals  Strengthen knee so it won't pop out.    Currently in Pain?  Yes    Pain Score  1     Pain Location  Knee    Pain Orientation  Right    Pain Descriptors / Indicators  Sore    Pain Type  Chronic pain    Pain Onset  More than a month ago    Pain  Frequency  Intermittent         [facility] PT Assessment - 07/15/19 0001      Assessment   Medical Diagnosis  Right MPFL reconstruction.    Referring Provider (PT)  Alferd Patee MD.    Onset Date/Surgical Date  11/14/18    Next MD Visit  TBD      Restrictions   Weight Bearing Restrictions  No                   [facility] Adult PT Treatment/Exercise - 07/15/19 0001      Knee/Hip Exercises: Aerobic   Recumbent Bike  L4 x12 min      Knee/Hip Exercises: Machines for Strengthening   Cybex Leg Press  2.5 PL, (SEAT 6) 2X20 REPS      Knee/Hip Exercises: Supine   Straight Leg Raises  Strengthening;Other (comment)    Straight Leg Raises Limitations  with VMS neuro re-ed of R quad      Modalities   Modalities  Vasopneumatic      Electrical Stimulation   Electrical Stimulation Location  R VMO/Quad with SLR    Electrical Stimulation Action  VMS cocontraction,     Electrical Stimulation Parameters  100 pps, 2sec  ramp, 260 usecs, x10 minutes    Electrical Stimulation Goals  Neuromuscular facilitation;Strength      Vasopneumatic   Number Minutes Vasopneumatic   10 minutes    Vasopnuematic Location   Knee    Vasopneumatic Pressure  Medium    Vasopneumatic Temperature   34                  PT Long Term Goals - 07/15/19 1024      PT LONG TERM GOAL #1   Title  Patient will be independent with HEP    Time  8    Period  Weeks    Status  Achieved      PT LONG TERM GOAL #2   Title  Patient will demonstrate ability to perform 3 SLR independently to improve right quadriceps strength.    Time  8    Period  Weeks    Status  Achieved      PT LONG TERM GOAL #3   Title  5/5 right hip knee strength to increase stability for functional tasks.    Time  8    Period  Weeks    Status  Not Met      PT LONG TERM GOAL #4   Title  Navigate stairs with no pain.    Time  8    Status  Achieved   no pain, weakness           Plan - 07/15/19 1040    Clinical Impression  Statement  Patient responded well to therapy with no reports of increased pain, just fatigue. Patient and PT discussed continuing HEP at home to maximize right quad strengthening. Patient goals were partially met. DC as she is returning back to school.    Personal Factors and Comorbidities  Comorbidity 1    Comorbidities  H/o patellar dislocations.    Examination-Activity Limitations  Locomotion Level    Stability/Clinical Decision Making  Stable/Uncomplicated    Clinical Decision Making  Low    Rehab Potential  Excellent    PT Frequency  2x / week    PT Duration  8 weeks    PT Treatment/Interventions  ADLs/Self Care Home Management;Electrical Stimulation;Cryotherapy;Moist Heat;Neuromuscular re-education;Therapeutic exercise;Therapeutic activities;Patient/family education;Manual techniques;Vasopneumatic Device    PT Next Visit Plan  DC    Consulted and Agree with Plan of Care  Patient       Patient will benefit from skilled therapeutic intervention in order to improve the following deficits and impairments:  Pain, Decreased strength, Decreased activity tolerance  Visit Diagnosis: 1. Muscle weakness (generalized)   2. Chronic pain of right knee   3. Stiffness of right knee, not elsewhere classified        Problem List Patient Active Problem List   Diagnosis Date Noted  . GAD (generalized anxiety disorder) 06/12/2019    Gabriela Eves, PT, DPT 07/15/2019, 12:22 PM  [facility] [address] [place], [place], [address] Phone: [phone]   Fax:  [phone]  Name: Diana Sandoval MRN: [id number] Date of Birth: Feb 18, 2001

## 2019-07-17 NOTE — Progress Notes (Signed)
Virtual Visit via Telephone Note  I connected with Diana Sandoval on 07/24/19 at 4:17 PM by telephone and verified that I am speaking with the correct person using two identifiers. [place]) and nobody is currently with her during this visit. The provider, Diana FudgeBRITNEY F Danaija Eskridge, FNP is located in their office at time of visit.  I discussed the limitations, risks, security and privacy concerns of performing an evaluation and management service by telephone and the availability of in person appointments. I also discussed with the patient that there may be a patient responsible charge related to this service. The patient expressed understanding and agreed to proceed.  Subjective: PCP: Diana Sandoval, Diana Sandoval F, FNP  Chief Complaint  Patient presents with  . Anxiety  . Depression   Patient is following up on anxiety and depression. She has previously failed therapy with Prozac, Lexapro, and Zoloft. She was started on Celexa 20 mg QD on 06/12/2019 (6 weeks ago). Patient reports she is feeling really good. She did decrease her dose to 10 mg due to waking up feeling anxious in the middle of the night. Once she decreased the dose the waking up anxious resolved.   [facility] 07/24/2019 06/12/2019  Decreased Interest 0 2  Down, Depressed, Hopeless 0 3  PHQ - 2 Score 0 5  Altered sleeping 0 3  Tired, decreased energy 0 2  Change in appetite 0 2  Feeling bad or failure about yourself  0 3  Trouble concentrating 1 1  Moving slowly or fidgety/restless 0 0  Suicidal thoughts 0 0  PHQ-9 Score 1 16  Difficult doing work/chores Not difficult at all Somewhat difficult   GAD 7 : Generalized Anxiety Score 07/24/2019 06/12/2019  Nervous, Anxious, on Edge 1 3  Control/stop worrying 1 3  Worry too much - different things 1 3  Trouble relaxing 1 3  Restless 1 3  Easily annoyed or irritable 0 2  Afraid - awful might happen 1 2  Total GAD 7  Score 6 19  Anxiety Difficulty Not difficult at all Somewhat difficult    ROS: Per HPI  Current Outpatient Medications:  .  citalopram (CELEXA) 20 MG tablet, Take 1 tablet (20 mg total) by mouth daily., Disp: 30 tablet, Rfl: 1 .  Melatonin 3 MG TABS, Take 1 tablet by mouth at bedtime., Disp: , Rfl:  .  [name] 3-0.02 MG tablet, TAKE ONE (1) TABLET EACH DAY, Disp: 84 tablet, Rfl: 0  No Known Allergies Past Medical History:  Diagnosis Date  . Anxiety   . Depression     Observations/Objective: A&O  No respiratory distress or wheezing audible over the phone Mood, judgement, and thought processes all WNL  Assessment and Plan: 1. Anxiety and depression - Much improved from 6 weeks ago. Patient is going to try to increase back to 20 mg on the Celexa. She is going to let me know before she needs her next refill which dose she feels the best on, 10 or 20 mg once daily.    Follow Up Instructions:  I discussed the assessment and treatment plan with the patient. The patient was provided an opportunity to ask questions and all were answered. The patient agreed with the plan and demonstrated an understanding of the instructions.   The patient was advised to call back or seek an in-person evaluation if the symptoms worsen or if the condition fails to improve as anticipated.  The above assessment and management plan was discussed with the patient. The patient verbalized understanding of and has agreed to the management plan. Patient is aware to call the clinic if symptoms persist or worsen. Patient is aware when to return to the clinic for a follow-up visit. Patient educated on when it is appropriate to go to the emergency department.   Time call ended: 4:26 PM  I provided 9 minutes of non-face-to-face time during this encounter.  Hendricks Limes, MSN, APRN, FNP-C [facility] 07/24/19

## 2019-07-24 ENCOUNTER — Encounter: Payer: Self-pay | Admitting: Family Medicine

## 2019-07-24 ENCOUNTER — Ambulatory Visit (INDEPENDENT_AMBULATORY_CARE_PROVIDER_SITE_OTHER): Payer: 59 | Admitting: Family Medicine

## 2019-07-24 DIAGNOSIS — F419 Anxiety disorder, unspecified: Secondary | ICD-10-CM | POA: Diagnosis not present

## 2019-07-24 DIAGNOSIS — F32A Depression, unspecified: Secondary | ICD-10-CM

## 2019-07-24 DIAGNOSIS — F329 Major depressive disorder, single episode, unspecified: Secondary | ICD-10-CM | POA: Diagnosis not present

## 2019-08-25 ENCOUNTER — Encounter: Payer: Self-pay | Admitting: Family Medicine

## 2019-08-26 ENCOUNTER — Encounter: Payer: Self-pay | Admitting: Family Medicine

## 2019-08-26 DIAGNOSIS — F411 Generalized anxiety disorder: Secondary | ICD-10-CM

## 2019-08-26 MED ORDER — CITALOPRAM HYDROBROMIDE 20 MG PO TABS: 20.0000 mg | ORAL_TABLET | Freq: Every day | ORAL | 1 refills | Status: DC

## 2019-10-23 ENCOUNTER — Other Ambulatory Visit: Payer: Self-pay | Admitting: Family Medicine

## 2019-10-23 DIAGNOSIS — F411 Generalized anxiety disorder: Secondary | ICD-10-CM

## 2019-11-11 ENCOUNTER — Encounter: Payer: Self-pay | Admitting: Family Medicine

## 2019-11-11 ENCOUNTER — Other Ambulatory Visit: Payer: Self-pay | Admitting: Family Medicine

## 2019-11-11 DIAGNOSIS — F411 Generalized anxiety disorder: Secondary | ICD-10-CM

## 2019-11-11 MED ORDER — CITALOPRAM HYDROBROMIDE 40 MG PO TABS: 40.0000 mg | ORAL_TABLET | Freq: Every day | ORAL | 2 refills | Status: DC

## 2019-12-21 ENCOUNTER — Other Ambulatory Visit: Payer: Self-pay

## 2019-12-28 ENCOUNTER — Telehealth: Payer: Self-pay

## 2019-12-28 MED ORDER — DROSPIRENONE-ETHINYL ESTRADIOL 3-0.02 MG PO TABS: ORAL_TABLET | ORAL | 0 refills | Status: DC

## 2019-12-28 NOTE — Telephone Encounter (Signed)
Patient last seen for CE August 2019.  We denied bcp and asked her to call the office to schedule CE.  She said she is at school in [place]. She was supposed to restart pack yesterday and is completely out. Debarah Crape spoke with her and scheduled her for CE on Weds 2/24 at 8:00am.  One pack sent to pharmacy.

## 2020-01-17 ENCOUNTER — Other Ambulatory Visit: Payer: Self-pay | Admitting: Women's Health

## 2020-01-18 NOTE — Telephone Encounter (Signed)
Annual exam scheduled on 01/27/20

## 2020-01-22 NOTE — Progress Notes (Signed)
Virtual Visit via Telephone Note  I connected with Diana Sandoval on 01/28/20 at 2:25 PM by telephone and verified that I am speaking with the correct person using two identifiers. Diana Sandoval is currently located in her car and nobody is currently with her during this visit. The provider, Loman Brooklyn, FNP is located in their office at time of visit.  I discussed the limitations, risks, security and privacy concerns of performing an evaluation and management service by telephone and the availability of in person appointments. I also discussed with the patient that there may be a patient responsible charge related to this service. The patient expressed understanding and agreed to proceed.  Subjective: PCP: Loman Brooklyn, FNP  Chief Complaint  Patient presents with  . Anxiety  . Depression   Patient is following up on anxiety and depression. She has previously failed therapy with Prozac, Lexapro, and Zoloft. She was started on Celexa 20 mg QD, which she initially decreased to 10 mg and then increased back up to 20 mg. It was then increased further to 40 mg due to additional stress and anxiety. She felt she was having more frequent panic attacks and OCD habits were more prevalent. She does not feel the increased dosage has been very helpful. Now she just feels really dissociated, like she is numb. She is interested in a change of medication.   ROS: Per HPI  Current Outpatient Medications:  .  citalopram (CELEXA) 40 MG tablet, Take 1 tablet (40 mg total) by mouth daily., Disp: 30 tablet, Rfl: 2 .  drospirenone-ethinyl estradiol ([name]) 3-0.02 MG tablet, TAKE ONE (1) TABLET EACH DAY, Disp: 3 Package, Rfl: 4 .  Melatonin 3 MG TABS, Take 1 tablet by mouth at bedtime., Disp: , Rfl:  .  venlafaxine XR (EFFEXOR XR) 37.5 MG 24 hr capsule, Take 1 capsule (37.5 mg total) by mouth daily with breakfast., Disp: 30 capsule, Rfl: 2  No Known Allergies Past Medical History:  Diagnosis Date  .  Anxiety   . Depression     Observations/Objective: A&O  No respiratory distress or wheezing audible over the phone Mood, judgement, and thought processes all WNL  Assessment and Plan: 1. Anxiety and depression - Celexa D/C'd. Rx'd Effexor. Advised patient to decrease Celexa from 40 mg to 20 mg for the next few days, then start Effexor. Patient is interested in [employer] testing but will have to discuss cost with her father and let me know.  - venlafaxine XR (EFFEXOR XR) 37.5 MG 24 hr capsule; Take 1 capsule (37.5 mg total) by mouth daily with breakfast.  Dispense: 30 capsule; Refill: 2   Follow Up Instructions: Return in about 6 weeks (around 03/08/2020) for anxiety and depression (telephone).  I discussed the assessment and treatment plan with the patient. The patient was provided an opportunity to ask questions and all were answered. The patient agreed with the plan and demonstrated an understanding of the instructions.   The patient was advised to call back or seek an in-person evaluation if the symptoms worsen or if the condition fails to improve as anticipated.  The above assessment and management plan was discussed with the patient. The patient verbalized understanding of and has agreed to the management plan. Patient is aware to call the clinic if symptoms persist or worsen. Patient is aware when to return to the clinic for a follow-up visit. Patient educated on when it is appropriate to go to the emergency department.   Time call  ended: 2:33 PM  I provided 10 minutes of non-face-to-face time during this encounter.  Diana Boston, MSN, APRN, FNP-C [facility] 01/28/20

## 2020-01-26 ENCOUNTER — Other Ambulatory Visit: Payer: Self-pay

## 2020-01-26 ENCOUNTER — Ambulatory Visit (INDEPENDENT_AMBULATORY_CARE_PROVIDER_SITE_OTHER): Payer: 59 | Admitting: Family Medicine

## 2020-01-26 DIAGNOSIS — F419 Anxiety disorder, unspecified: Secondary | ICD-10-CM

## 2020-01-26 DIAGNOSIS — F329 Major depressive disorder, single episode, unspecified: Secondary | ICD-10-CM | POA: Diagnosis not present

## 2020-01-26 DIAGNOSIS — F32A Depression, unspecified: Secondary | ICD-10-CM

## 2020-01-26 MED ORDER — VENLAFAXINE HCL ER 37.5 MG PO CP24: 37.5000 mg | ORAL_CAPSULE | Freq: Every day | ORAL | 2 refills | Status: AC

## 2020-01-27 ENCOUNTER — Ambulatory Visit (INDEPENDENT_AMBULATORY_CARE_PROVIDER_SITE_OTHER): Payer: 59 | Admitting: Women's Health

## 2020-01-27 ENCOUNTER — Encounter: Payer: Self-pay | Admitting: Women's Health

## 2020-01-27 VITALS — BP 120/78 | Ht 67.0 in | Wt 134.0 lb

## 2020-01-27 DIAGNOSIS — Z01419 Encounter for gynecological examination (general) (routine) without abnormal findings: Secondary | ICD-10-CM | POA: Diagnosis not present

## 2020-01-27 LAB — CBC WITH DIFFERENTIAL/PLATELET
Absolute Monocytes: 339 cells/uL (ref 200–950)
Basophils Absolute: 22 cells/uL (ref 0–200)
Basophils Relative: 0.5 %
Eosinophils Absolute: 62 cells/uL (ref 15–500)
Eosinophils Relative: 1.4 %
HCT: 38.2 % (ref 35.0–45.0)
Hemoglobin: 13.2 g/dL (ref 11.7–15.5)
Lymphs Abs: 1848 cells/uL (ref 850–3900)
MCH: 33.2 pg — ABNORMAL HIGH (ref 27.0–33.0)
MCHC: 34.6 g/dL (ref 32.0–36.0)
MCV: 96 fL (ref 80.0–100.0)
MPV: 11 fL (ref 7.5–12.5)
Monocytes Relative: 7.7 %
Neutro Abs: 2130 cells/uL (ref 1500–7800)
Neutrophils Relative %: 48.4 %
Platelets: 188 10*3/uL (ref 140–400)
RBC: 3.98 10*6/uL (ref 3.80–5.10)
RDW: 11.9 % (ref 11.0–15.0)
Total Lymphocyte: 42 %
WBC: 4.4 10*3/uL (ref 3.8–10.8)

## 2020-01-27 IMAGING — DX DG KNEE COMPLETE 4+V*R*
4 series · 4 of 4 positions shown · non-contrast
Comparison: Right knee x-rays dated October 11, 2012.

CLINICAL DATA: Acute knee pain for the past 2 weeks. No known
injury. History of prior right patellar dislocation.

EXAM:
RIGHT KNEE - COMPLETE 4+ VIEW

[knee ap]
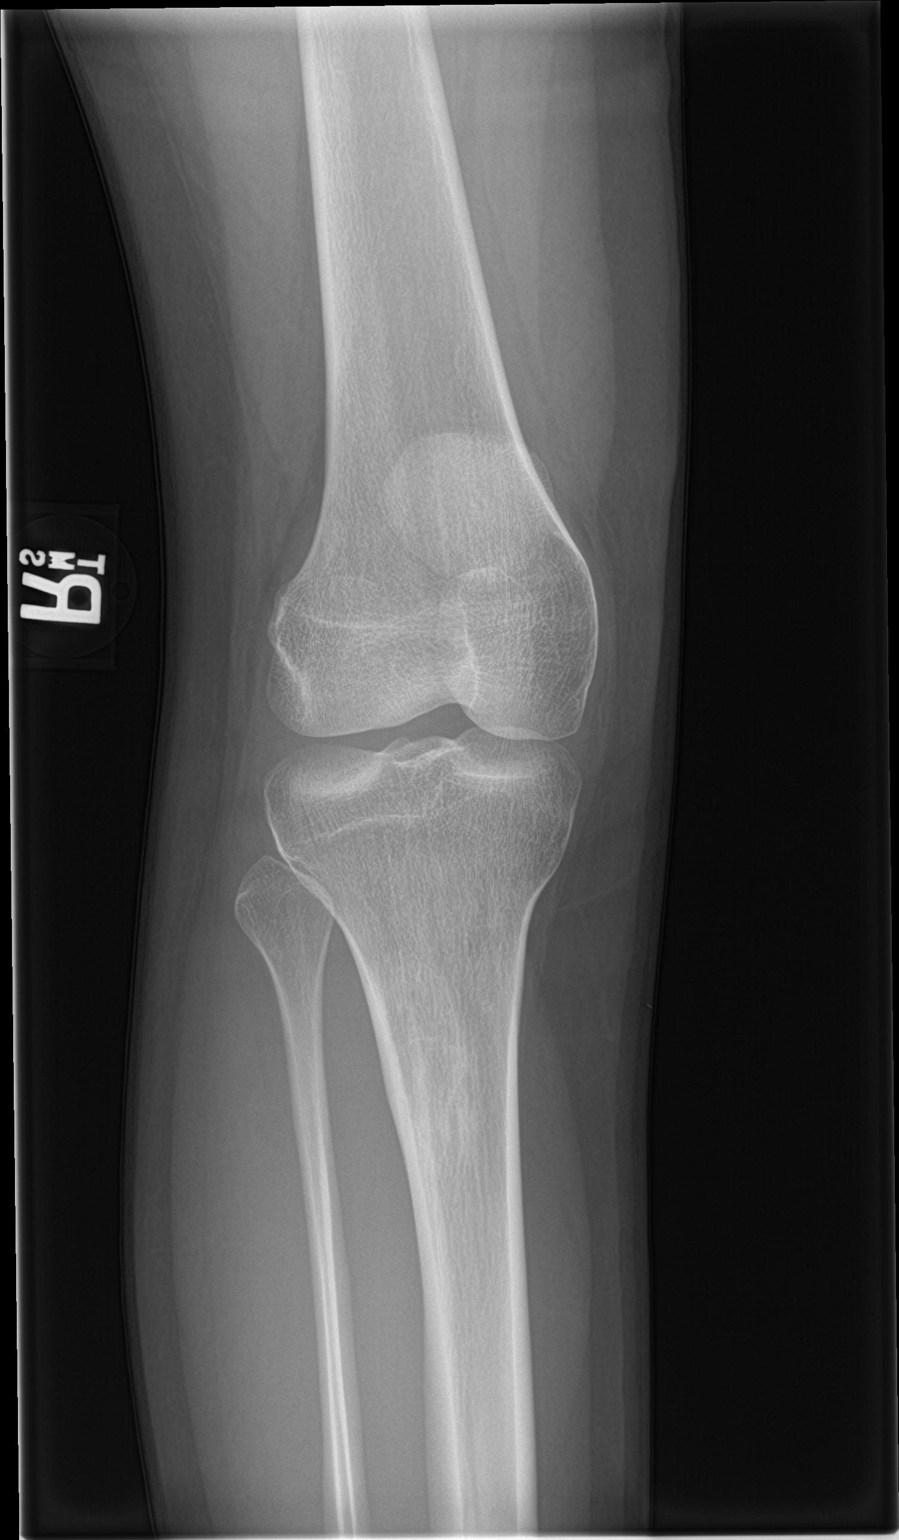

[knee lat]
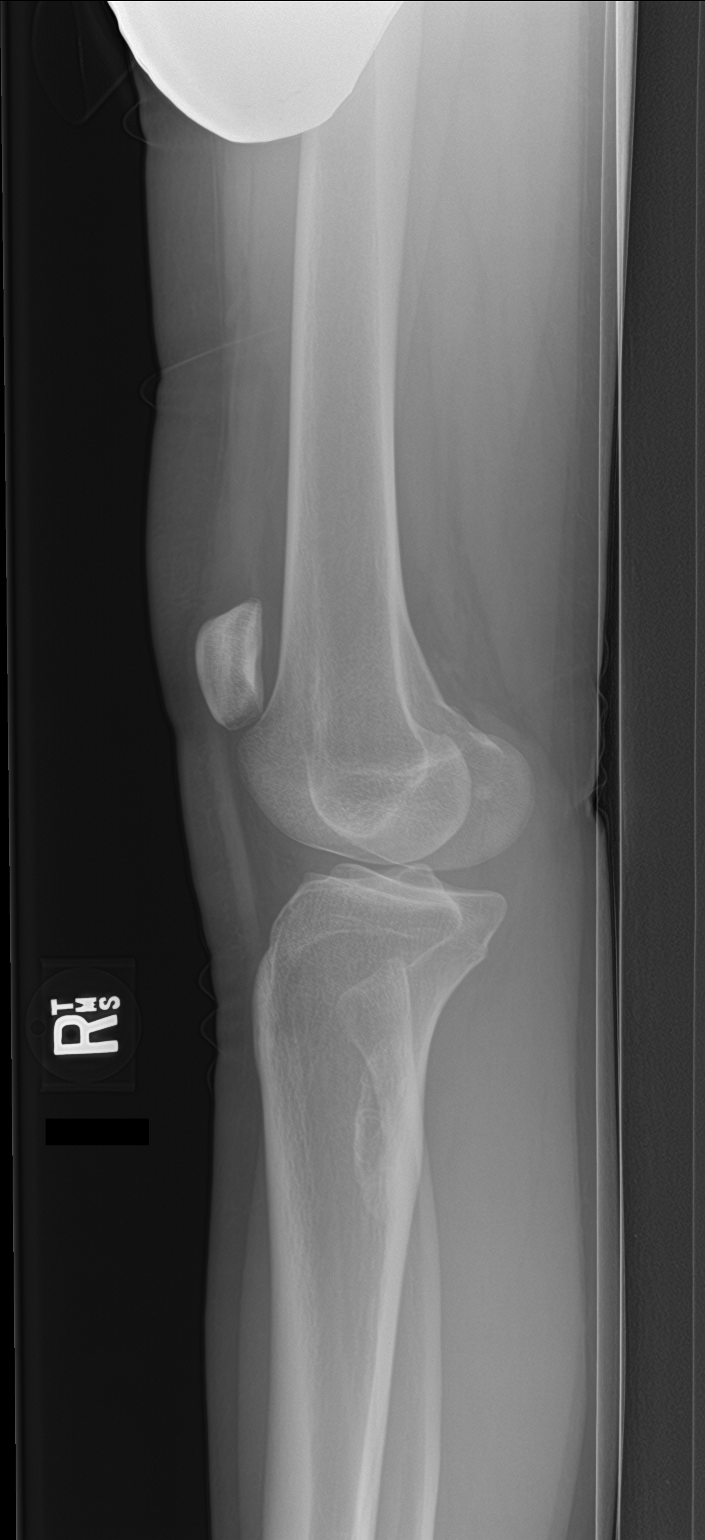

[knee obl (1 of 2)]
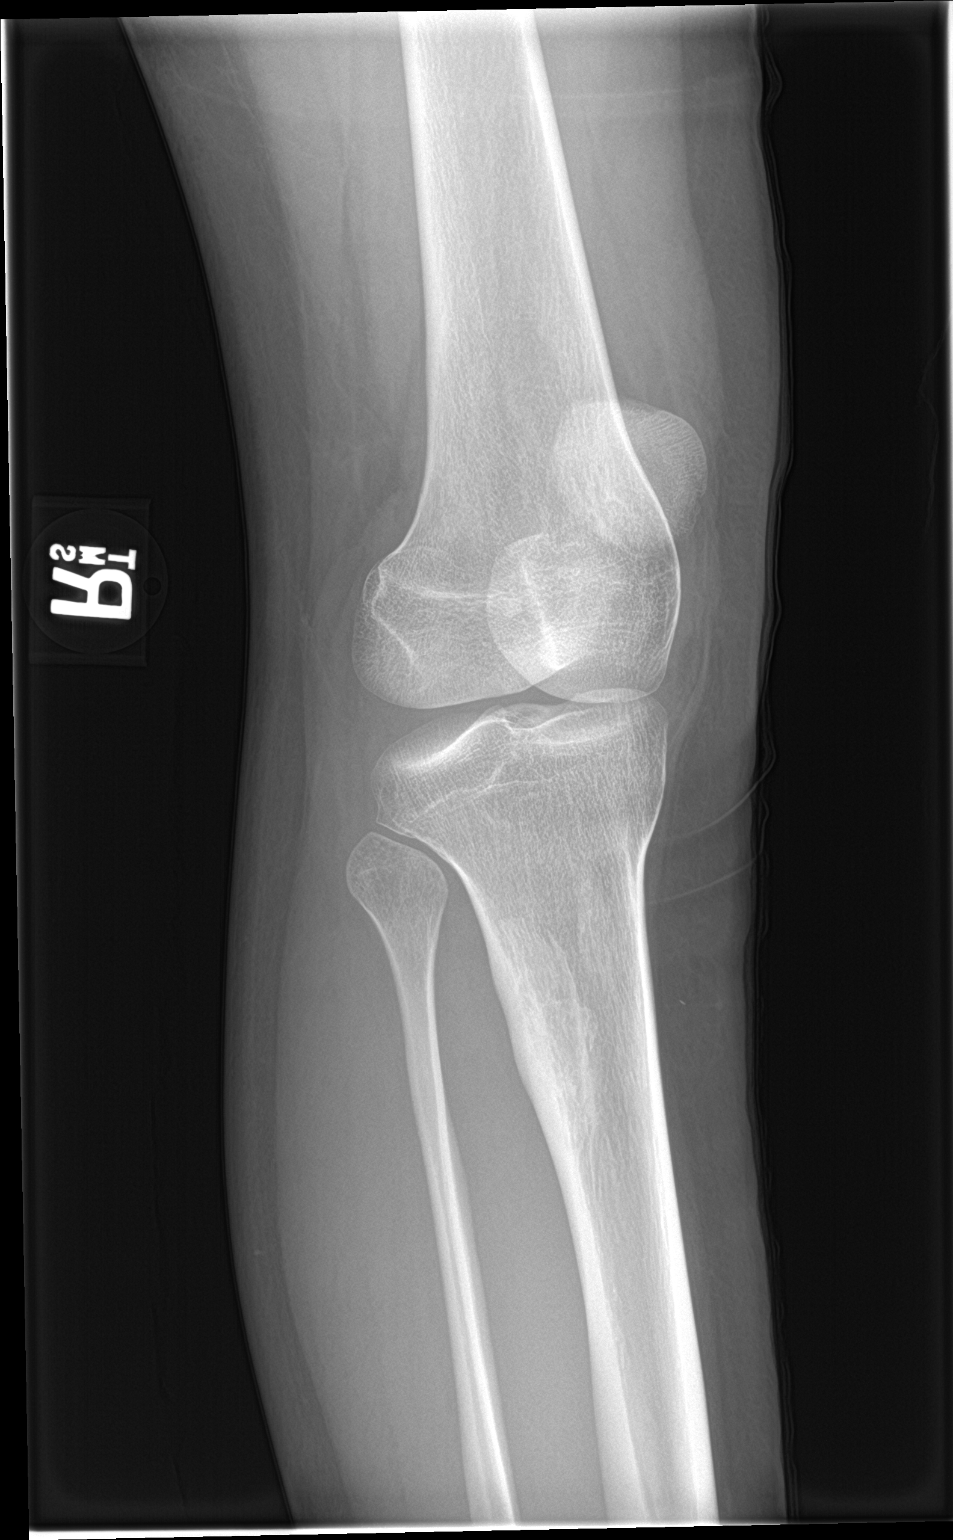

[knee obl (2 of 2)]
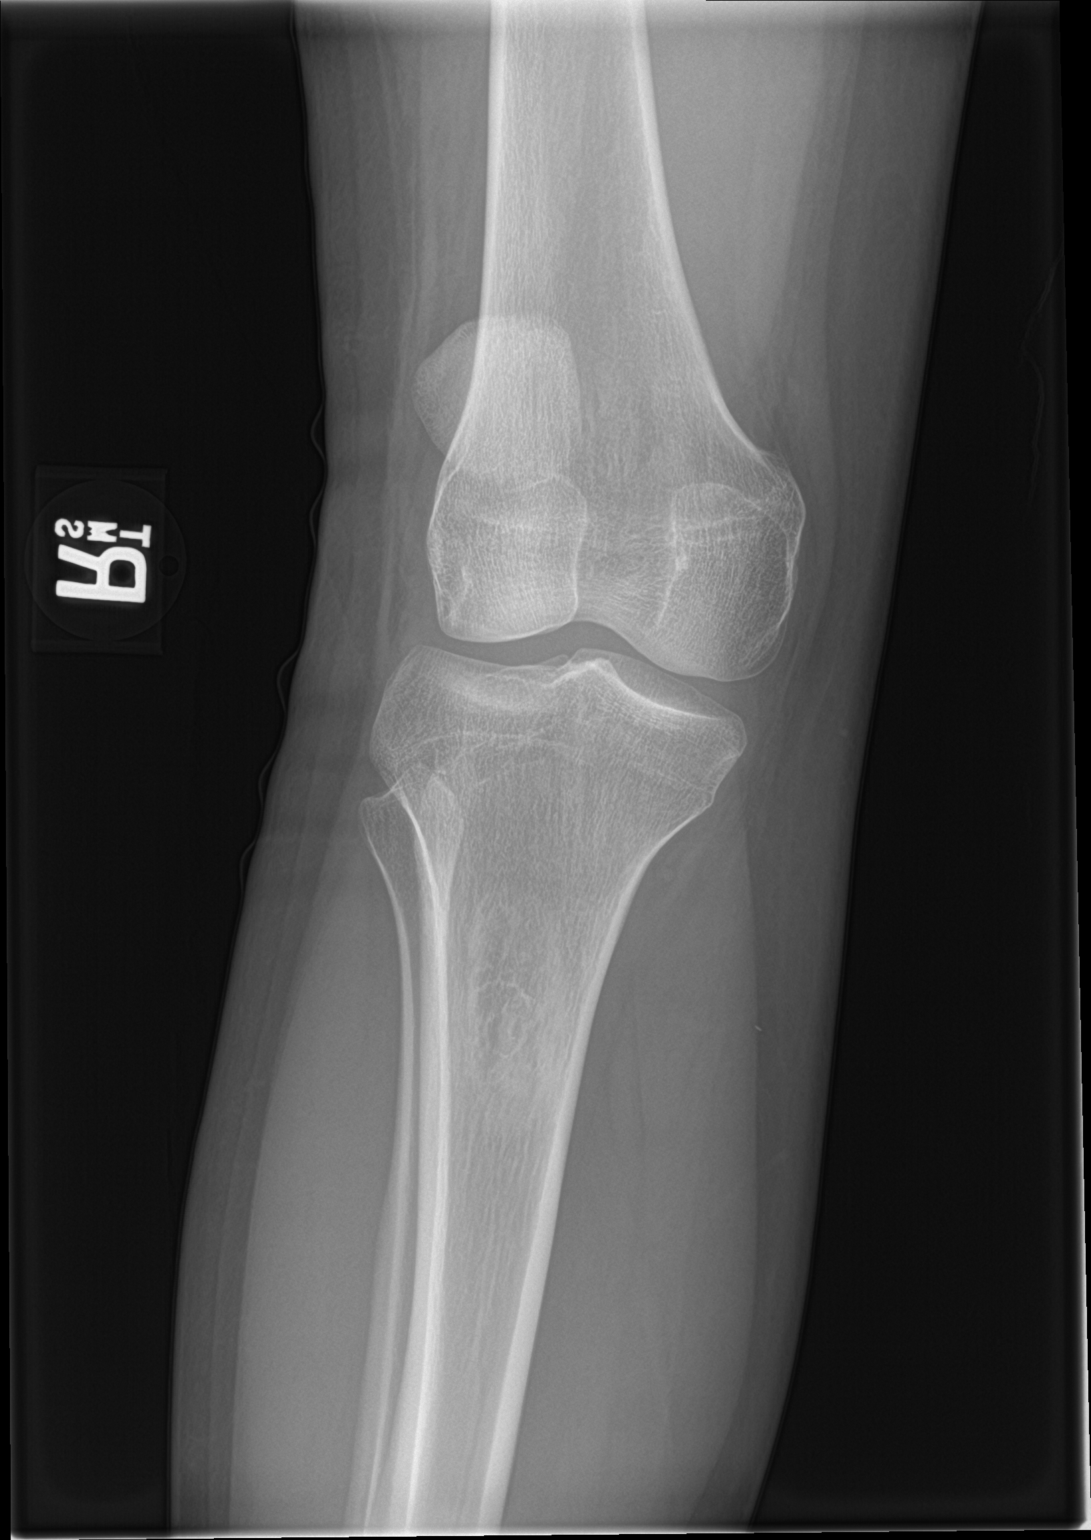

[4 of 4 positions shown; findings below may reference images not displayed]

FINDINGS: No acute fracture or dislocation. No joint effusion. Joint spaces
are preserved. Bone mineralization is normal. Healing nonossifying
fibroma in the proximal tibia. Soft tissues are unremarkable.
IMPRESSION: Negative.

## 2020-01-27 MED ORDER — DROSPIRENONE-ETHINYL ESTRADIOL 3-0.02 MG PO TABS: ORAL_TABLET | ORAL | 4 refills | Status: AC

## 2020-01-27 NOTE — Patient Instructions (Signed)
Good to see you today Multi-vitamin daily Health Maintenance, Female Adopting a healthy lifestyle and getting preventive care are important in promoting health and wellness. Ask your health care provider about:  The right schedule for you to have regular tests and exams.  Things you can do on your own to prevent diseases and keep yourself healthy. What should I know about diet, weight, and exercise? Eat a healthy diet   Eat a diet that includes plenty of vegetables, fruits, low-fat dairy products, and lean protein.  Do not eat a lot of foods that are high in solid fats, added sugars, or sodium. Maintain a healthy weight Body mass index (BMI) is used to identify weight problems. It estimates body fat based on height and weight. Your health care provider can help determine your BMI and help you achieve or maintain a healthy weight. Get regular exercise Get regular exercise. This is one of the most important things you can do for your health. Most adults should:  Exercise for at least 150 minutes each week. The exercise should increase your heart rate and make you sweat (moderate-intensity exercise).  Do strengthening exercises at least twice a week. This is in addition to the moderate-intensity exercise.  Spend less time sitting. Even light physical activity can be beneficial. Watch cholesterol and blood lipids Have your blood tested for lipids and cholesterol at 21 years of age, then have this test every 5 years. Have your cholesterol levels checked more often if:  Your lipid or cholesterol levels are high.  You are older than 21 years of age.  You are at high risk for heart disease. What should I know about cancer screening? Depending on your health history and family history, you may need to have cancer screening at various ages. This may include screening for:  Breast cancer.  Cervical cancer.  Colorectal cancer.  Skin cancer.  Lung cancer. What should I know about  heart disease, diabetes, and high blood pressure? Blood pressure and heart disease  High blood pressure causes heart disease and increases the risk of stroke. This is more likely to develop in people who have high blood pressure readings, are of African descent, or are overweight.  Have your blood pressure checked: ? Every 3-5 years if you are 5-34 years of age. ? Every year if you are 65 years old or older. Diabetes Have regular diabetes screenings. This checks your fasting blood sugar level. Have the screening done:  Once every three years after age 20 if you are at a normal weight and have a low risk for diabetes.  More often and at a younger age if you are overweight or have a high risk for diabetes. What should I know about preventing infection? Hepatitis B If you have a higher risk for hepatitis B, you should be screened for this virus. Talk with your health care provider to find out if you are at risk for hepatitis B infection. Hepatitis C Testing is recommended for:  Everyone born from [birth year] through 1967.  Anyone with known risk factors for hepatitis C. Sexually transmitted infections (STIs)  Get screened for STIs, including gonorrhea and chlamydia, if: ? You are sexually active and are younger than 21 years of age. ? You are older than 21 years of age and your health care provider tells you that you are at risk for this type of infection. ? Your sexual activity has changed since you were last screened, and you are at increased risk for chlamydia or  gonorrhea. Ask your health care provider if you are at risk.  Ask your health care provider about whether you are at high risk for HIV. Your health care provider may recommend a prescription medicine to help prevent HIV infection. If you choose to take medicine to prevent HIV, you should first get tested for HIV. You should then be tested every 3 months for as long as you are taking the medicine. Pregnancy  If you are about to  stop having your period (premenopausal) and you may become pregnant, seek counseling before you get pregnant.  Take 400 to 800 micrograms (mcg) of folic acid every day if you become pregnant.  Ask for birth control (contraception) if you want to prevent pregnancy. Osteoporosis and menopause Osteoporosis is a disease in which the bones lose minerals and strength with aging. This can result in bone fractures. If you are 65 years old or older, or if you are at risk for osteoporosis and fractures, ask your health care provider if you should:  Be screened for bone loss.  Take a calcium or vitamin D supplement to lower your risk of fractures.  Be given hormone replacement therapy (HRT) to treat symptoms of menopause. Follow these instructions at home: Lifestyle  Do not use any products that contain nicotine or tobacco, such as cigarettes, e-cigarettes, and chewing tobacco. If you need help quitting, ask your health care provider.  Do not use street drugs.  Do not share needles.  Ask your health care provider for help if you need support or information about quitting drugs. Alcohol use  Do not drink alcohol if: ? Your health care provider tells you not to drink. ? You are pregnant, may be pregnant, or are planning to become pregnant.  If you drink alcohol: ? Limit how much you use to 0-1 drink a day. ? Limit intake if you are breastfeeding.  Be aware of how much alcohol is in your drink. In the [place] one drink equals one 12 oz bottle of beer (355 mL), one 5 oz glass of wine (148 mL), or one 1 oz glass of hard liquor (44 mL). General instructions  Schedule regular health, dental, and eye exams.  Stay current with your vaccines.  Tell your health care provider if: ? You often feel depressed. ? You have ever been abused or do not feel safe at home. Summary  Adopting a healthy lifestyle and getting preventive care are important in promoting health and wellness.  Follow your  health care provider's instructions about healthy diet, exercising, and getting tested or screened for diseases.  Follow your health care provider's instructions on monitoring your cholesterol and blood pressure. This information is not intended to replace advice given to you by your health care provider. Make sure you discuss any questions you have with your health care provider. Document Revised: 11/12/2018 Document Reviewed: 11/12/2018 Elsevier Patient Education  2020 [employer] Inc[employer]  

## 2020-01-27 NOTE — Progress Notes (Signed)
Diana Sandoval March 16, 2001 [id number]    History:    Presents for annual exam.  Monthly cycle on Diana Sandoval without complaint.  Not sexually active, denies need for STD screen.  Gardasil series completed.  Anxiety and depression does see a counselor starting a new medication.  Past medical history, past surgical history, family history and social history were all reviewed and documented in the EPIC chart.  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  Mother hypertension, father healthy.  ROS:  A ROS was performed and pertinent positives and negatives are included.  Exam:  Vitals:   01/27/20 0759  BP: 120/78  Weight: 134 lb (60.8 kg)  Height: 5\' 7"  (1.702 m)   Body mass index is 20.99 kg/m.   General appearance:  Normal Thyroid:  Symmetrical, normal in size, without palpable masses or nodularity. Respiratory  Auscultation:  Clear without wheezing or rhonchi Cardiovascular  Auscultation:  Regular rate, without rubs, murmurs or gallops  Edema/varicosities:  Not grossly evident Abdominal  Soft,nontender, without masses, guarding or rebound.  Liver/spleen:  No organomegaly noted  Hernia:  None appreciated  Skin  Inspection:  Grossly normal   Breasts: Examined lying and sitting.     Right: Without masses, retractions, discharge or axillary adenopathy.     Left: Without masses, retractions, discharge or axillary adenopathy. Gentitourinary   Inguinal/mons:  Normal without inguinal adenopathy  External genitalia:  Normal  BUS/Urethra/Skene's glands:  Normal  Vagina:  Normal  Cervix:  Normal  Uterus:  normal in size, shape and contour.  Midline and mobile  Adnexa/parametria:     Rt: Without masses or tenderness.   Lt: Without masses or tenderness.  Anus and perineum: Normal   Assessment/Plan:  19 y.o. S [place] G0 for annual exam without complaint of vaginal discharge, urinary symptoms, abdominal pain.  Monthly cycle on Diana Sandoval Anxiety-counselor, primary care manages  meds  Plan: Rx for [place] given, risks of blood clots and strokes reviewed.  Aware of importance of condoms consistently if becomes sexually active, denies need for STD screen.[place]  SBEs, exercise, calcium rich foods, MVI daily encouraged.  Reviewed importance of self-care, leisure activities, encouraged yoga.  CBC,    Marland Kitchen [facility], 8:30 AM 01/27/2020

## 2020-01-28 ENCOUNTER — Encounter: Payer: Self-pay | Admitting: Family Medicine

## 2020-03-16 ENCOUNTER — Other Ambulatory Visit: Payer: Self-pay | Admitting: Family Medicine

## 2020-03-16 DIAGNOSIS — F411 Generalized anxiety disorder: Secondary | ICD-10-CM

## 2020-06-19 IMAGING — DX DG ABDOMEN ACUTE W/ 1V CHEST
3 series · 3 of 3 positions shown · non-contrast
Comparison: October 20, 2016 chest x-ray

CLINICAL DATA: Constipation

EXAM:
DG ABDOMEN ACUTE W/ 1V CHEST

[chest pa]
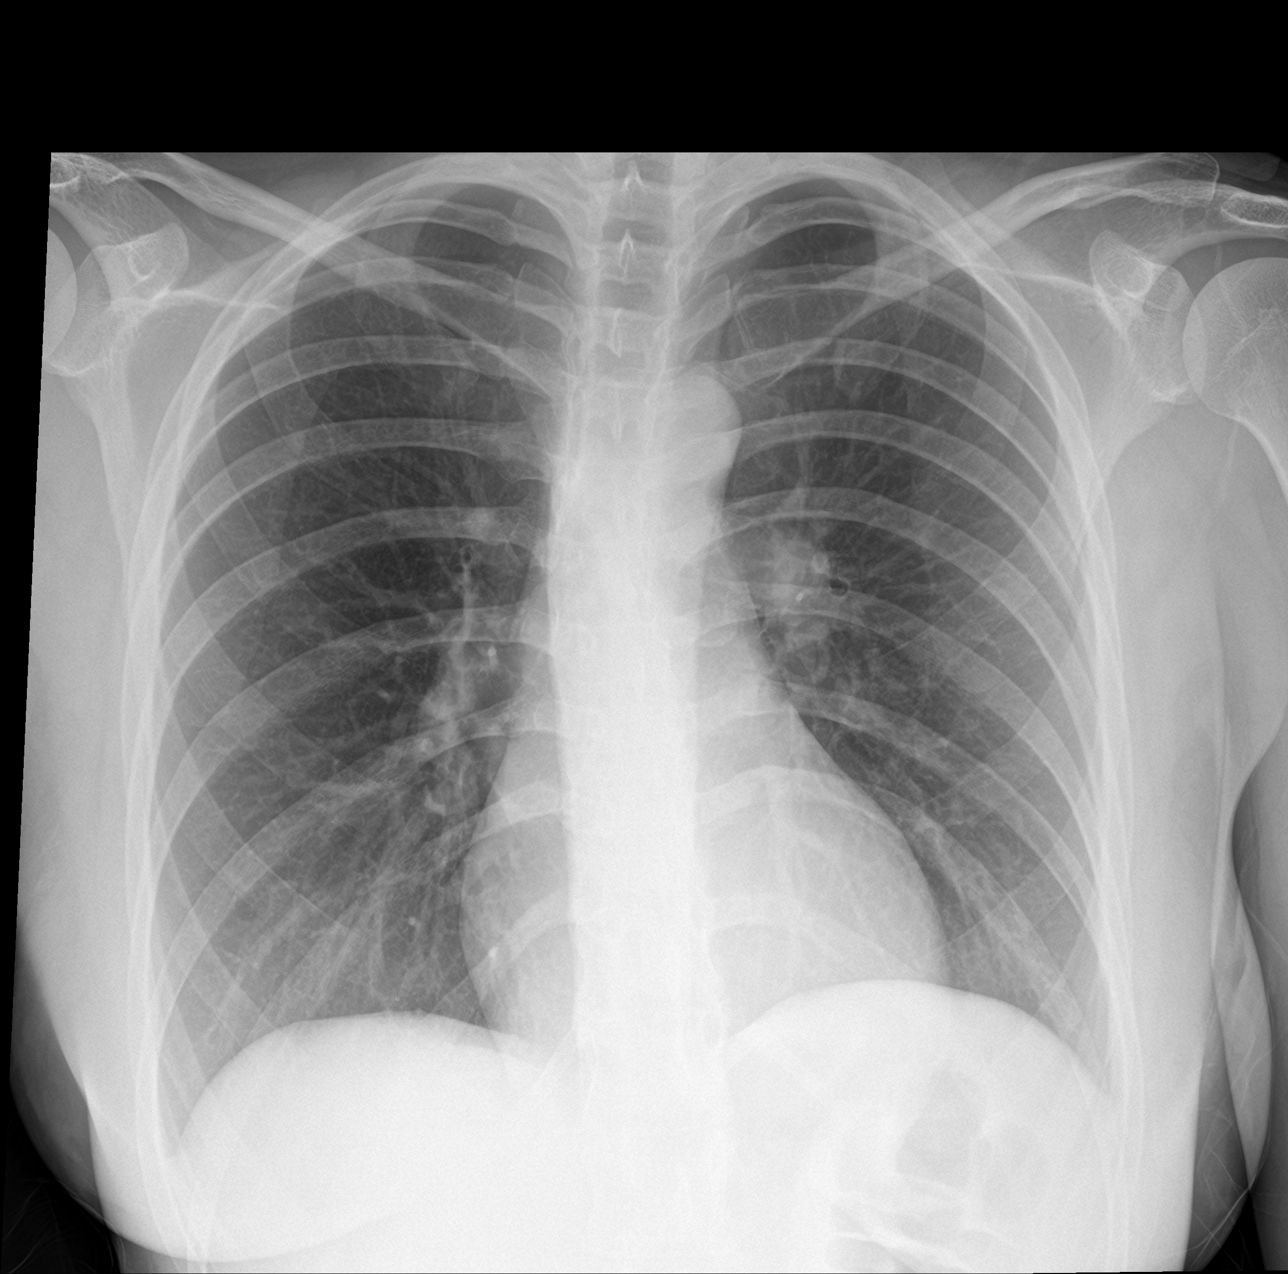

[abdomen erect]
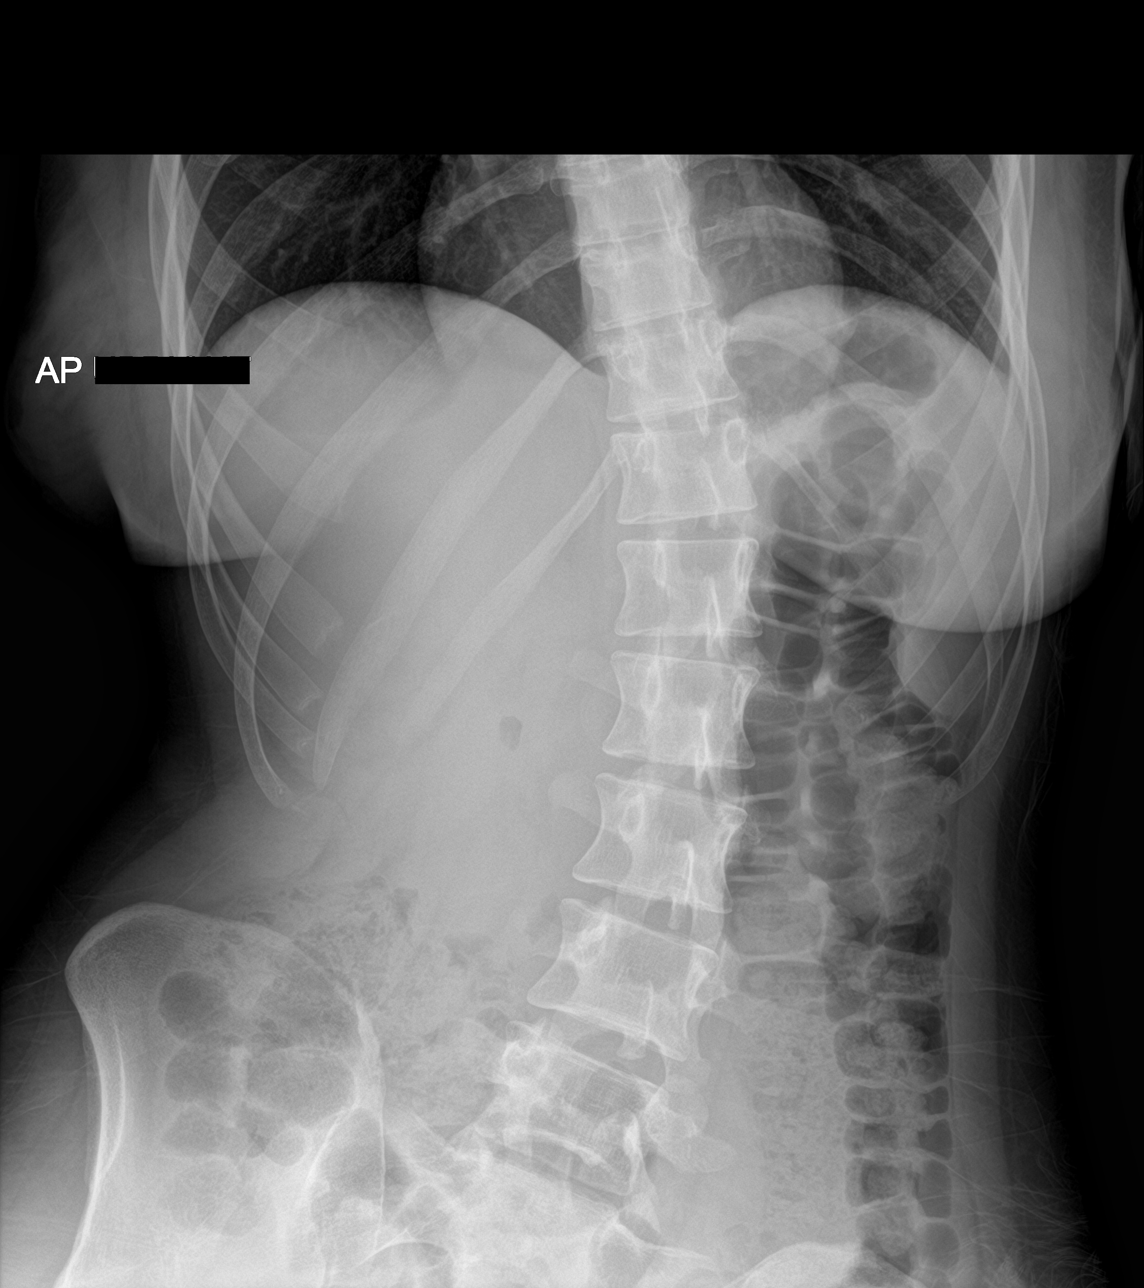

[abdomen supine]
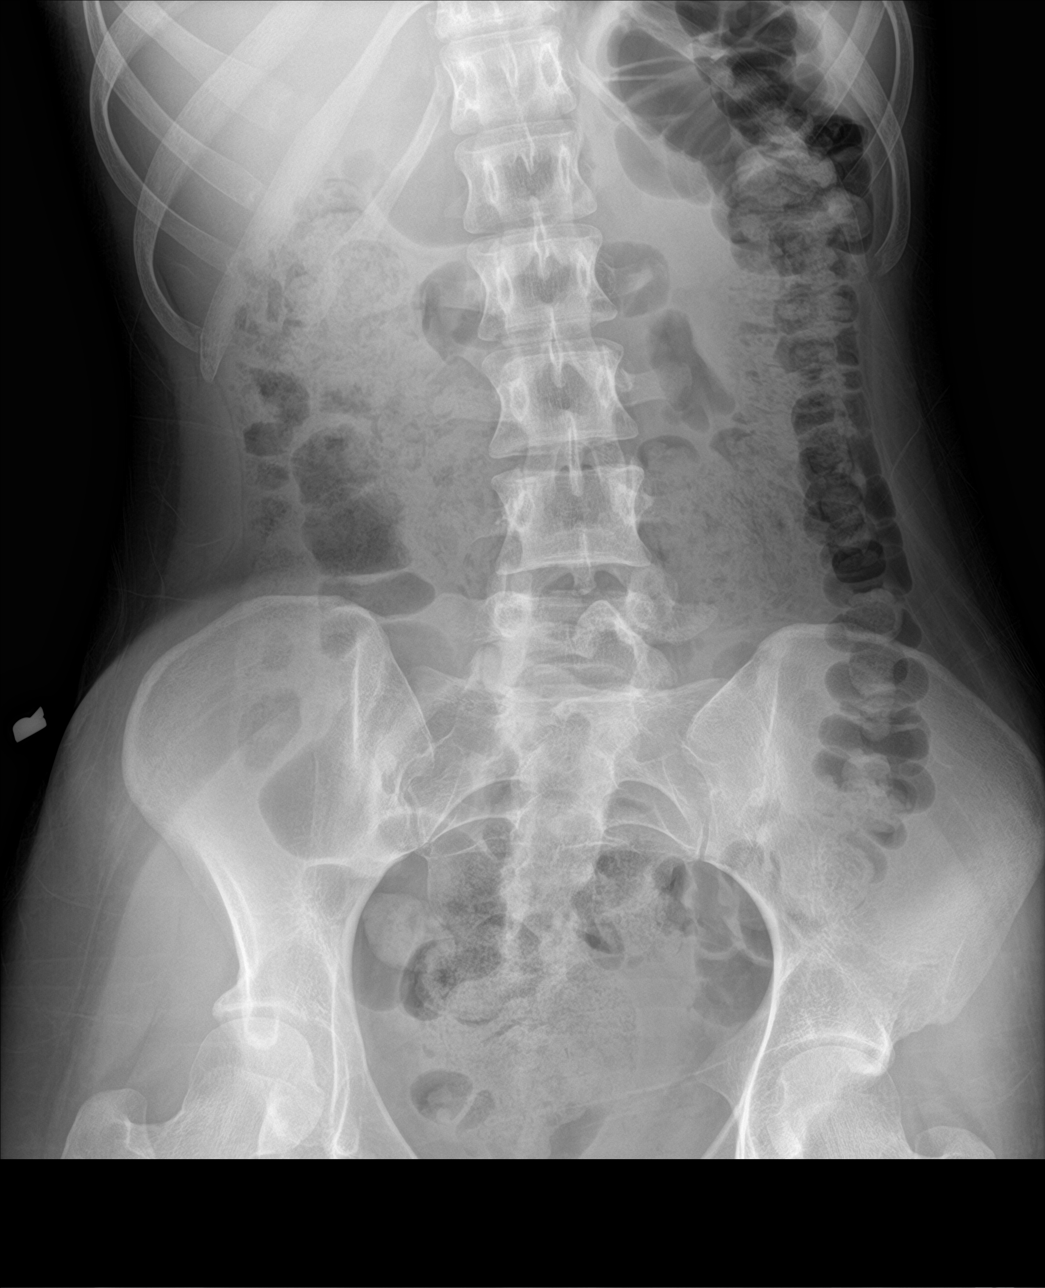

[3 of 3 positions shown; findings below may reference images not displayed]

FINDINGS: There is no evidence of dilated bowel loops or free intraperitoneal
air. Extensive bowel content is identified throughout colon. There
is scoliosis of spine. No radiopaque calculi or other significant
radiographic abnormality is seen. Heart size and mediastinal
contours are within normal limits. Both lungs are clear.
IMPRESSION: No bowel obstruction.  Constipation.

No acute cardiopulmonary disease.
